# Patient Record
Sex: Male | Born: 1955 | Race: White | Hispanic: No | Marital: Married | Smoking: Former smoker
Health system: Southern US, Community
[De-identification: ages and names within clinical notes are randomized; demographics above are authoritative.]

## PROBLEM LIST (undated history)

## (undated) DIAGNOSIS — C801 Malignant (primary) neoplasm, unspecified: Secondary | ICD-10-CM

## (undated) DIAGNOSIS — G4730 Sleep apnea, unspecified: Secondary | ICD-10-CM

## (undated) DIAGNOSIS — N189 Chronic kidney disease, unspecified: Secondary | ICD-10-CM

## (undated) DIAGNOSIS — J302 Other seasonal allergic rhinitis: Secondary | ICD-10-CM

## (undated) DIAGNOSIS — M1990 Unspecified osteoarthritis, unspecified site: Secondary | ICD-10-CM

## (undated) HISTORY — PX: OTHER SURGICAL HISTORY: SHX169

## (undated) HISTORY — DX: Malignant (primary) neoplasm, unspecified: C80.1

## (undated) HISTORY — PX: LASIK: SHX215

## (undated) HISTORY — DX: Unspecified osteoarthritis, unspecified site: M19.90

## (undated) HISTORY — DX: Chronic kidney disease, unspecified: N18.9

## (undated) HISTORY — PX: TONSILLECTOMY: SUR1361

---

## 1999-08-28 ENCOUNTER — Ambulatory Visit: Admission: RE | Admit: 1999-08-28 | Discharge: 1999-08-28 | Payer: Self-pay | Admitting: Internal Medicine

## 2004-04-15 ENCOUNTER — Ambulatory Visit (HOSPITAL_COMMUNITY): Admission: RE | Admit: 2004-04-15 | Discharge: 2004-04-17 | Payer: Self-pay | Admitting: Orthopedic Surgery

## 2004-05-19 ENCOUNTER — Encounter: Admission: RE | Admit: 2004-05-19 | Discharge: 2004-05-19 | Payer: Self-pay | Admitting: Internal Medicine

## 2004-10-11 ENCOUNTER — Encounter: Admission: RE | Admit: 2004-10-11 | Discharge: 2004-10-11 | Payer: Self-pay | Admitting: Internal Medicine

## 2005-02-18 ENCOUNTER — Ambulatory Visit (HOSPITAL_COMMUNITY): Admission: RE | Admit: 2005-02-18 | Discharge: 2005-02-18 | Payer: Self-pay | Admitting: Orthopedic Surgery

## 2005-06-17 ENCOUNTER — Encounter: Admission: RE | Admit: 2005-06-17 | Discharge: 2005-06-17 | Payer: Self-pay | Admitting: Internal Medicine

## 2006-05-31 ENCOUNTER — Encounter: Admission: RE | Admit: 2006-05-31 | Discharge: 2006-05-31 | Payer: Self-pay | Admitting: Radiation Oncology

## 2006-10-16 ENCOUNTER — Encounter: Admission: RE | Admit: 2006-10-16 | Discharge: 2006-10-16 | Payer: Self-pay | Admitting: Internal Medicine

## 2011-01-01 ENCOUNTER — Encounter: Payer: Self-pay | Admitting: Internal Medicine

## 2011-01-02 ENCOUNTER — Encounter: Payer: Self-pay | Admitting: Internal Medicine

## 2011-08-21 ENCOUNTER — Emergency Department (HOSPITAL_COMMUNITY): Payer: BC Managed Care – PPO

## 2011-08-21 ENCOUNTER — Emergency Department (HOSPITAL_COMMUNITY)
Admission: EM | Admit: 2011-08-21 | Discharge: 2011-08-21 | Disposition: A | Discharge status: Home / Self Care | Payer: BC Managed Care – PPO | Attending: Emergency Medicine | Admitting: Emergency Medicine

## 2011-08-21 DIAGNOSIS — M545 Low back pain, unspecified: Secondary | ICD-10-CM | POA: Insufficient documentation

## 2011-08-21 DIAGNOSIS — E119 Type 2 diabetes mellitus without complications: Secondary | ICD-10-CM | POA: Insufficient documentation

## 2011-08-21 DIAGNOSIS — N201 Calculus of ureter: Secondary | ICD-10-CM | POA: Insufficient documentation

## 2011-08-21 DIAGNOSIS — R109 Unspecified abdominal pain: Secondary | ICD-10-CM | POA: Insufficient documentation

## 2011-08-21 DIAGNOSIS — N289 Disorder of kidney and ureter, unspecified: Secondary | ICD-10-CM | POA: Insufficient documentation

## 2011-08-21 LAB — POCT I-STAT, CHEM 8
BUN: 20 mg/dL (ref 6–23)
Calcium, Ion: 1.21 mmol/L (ref 1.12–1.32)
Chloride: 103 mEq/L (ref 96–112)
Creatinine, Ser: 1.1 mg/dL (ref 0.50–1.35)
Glucose, Bld: 168 mg/dL — ABNORMAL HIGH (ref 70–99)
HCT: 48 % (ref 39.0–52.0)
Hemoglobin: 16.3 g/dL (ref 13.0–17.0)
Potassium: 4.3 mEq/L (ref 3.5–5.1)
Sodium: 139 mEq/L (ref 135–145)
TCO2: 25 mmol/L (ref 0–100)

## 2011-08-21 LAB — URINE MICROSCOPIC-ADD ON

## 2011-08-21 LAB — URINALYSIS, ROUTINE W REFLEX MICROSCOPIC
Glucose, UA: NEGATIVE mg/dL
Ketones, ur: 40 mg/dL — AB
Nitrite: NEGATIVE
Protein, ur: 100 mg/dL — AB
Specific Gravity, Urine: 1.027 (ref 1.005–1.030)
Urobilinogen, UA: 1 mg/dL (ref 0.0–1.0)
pH: 5 (ref 5.0–8.0)

## 2011-08-23 ENCOUNTER — Other Ambulatory Visit: Payer: Self-pay | Admitting: Urology

## 2011-08-23 DIAGNOSIS — N281 Cyst of kidney, acquired: Secondary | ICD-10-CM

## 2011-08-23 LAB — URINE CULTURE
Colony Count: NO GROWTH
Culture  Setup Time: 201209092144
Culture: NO GROWTH

## 2011-08-24 ENCOUNTER — Encounter (HOSPITAL_COMMUNITY): Payer: BC Managed Care – PPO

## 2011-08-24 ENCOUNTER — Other Ambulatory Visit: Payer: Self-pay | Admitting: Urology

## 2011-08-24 LAB — CBC
HCT: 42.4 % (ref 39.0–52.0)
Hemoglobin: 13.6 g/dL (ref 13.0–17.0)
MCH: 28.3 pg (ref 26.0–34.0)
MCHC: 32.1 g/dL (ref 30.0–36.0)
MCV: 88.1 fL (ref 78.0–100.0)
Platelets: 302 10*3/uL (ref 150–400)
RBC: 4.81 MIL/uL (ref 4.22–5.81)
RDW: 13.7 % (ref 11.5–15.5)
WBC: 10.4 10*3/uL (ref 4.0–10.5)

## 2011-08-24 LAB — SURGICAL PCR SCREEN
MRSA, PCR: NEGATIVE
Staphylococcus aureus: NEGATIVE

## 2011-08-25 ENCOUNTER — Ambulatory Visit (HOSPITAL_COMMUNITY)
Admission: RE | Admit: 2011-08-25 | Discharge: 2011-08-25 | Disposition: A | Discharge status: Home / Self Care | Payer: BC Managed Care – PPO | Source: Ambulatory Visit | Attending: Urology | Admitting: Urology

## 2011-08-25 DIAGNOSIS — Z79899 Other long term (current) drug therapy: Secondary | ICD-10-CM | POA: Insufficient documentation

## 2011-08-25 DIAGNOSIS — Z01810 Encounter for preprocedural cardiovascular examination: Secondary | ICD-10-CM | POA: Insufficient documentation

## 2011-08-25 DIAGNOSIS — G4733 Obstructive sleep apnea (adult) (pediatric): Secondary | ICD-10-CM | POA: Insufficient documentation

## 2011-08-25 DIAGNOSIS — N201 Calculus of ureter: Secondary | ICD-10-CM | POA: Insufficient documentation

## 2011-08-25 DIAGNOSIS — Z01812 Encounter for preprocedural laboratory examination: Secondary | ICD-10-CM | POA: Insufficient documentation

## 2011-08-25 DIAGNOSIS — E7800 Pure hypercholesterolemia, unspecified: Secondary | ICD-10-CM | POA: Insufficient documentation

## 2011-08-25 LAB — GLUCOSE, CAPILLARY
Glucose-Capillary: 129 mg/dL — ABNORMAL HIGH (ref 70–99)
Glucose-Capillary: 131 mg/dL — ABNORMAL HIGH (ref 70–99)

## 2011-09-02 ENCOUNTER — Ambulatory Visit
Admission: RE | Admit: 2011-09-02 | Discharge: 2011-09-02 | Disposition: A | Discharge status: Home / Self Care | Payer: BC Managed Care – PPO | Source: Ambulatory Visit | Attending: Urology | Admitting: Urology

## 2011-09-02 DIAGNOSIS — N281 Cyst of kidney, acquired: Secondary | ICD-10-CM

## 2011-09-02 MED ORDER — GADOBENATE DIMEGLUMINE 529 MG/ML IV SOLN
20.0000 mL | Freq: Once | INTRAVENOUS | Status: AC | PRN
  Administered 2011-09-02: 20 mL via INTRAVENOUS

## 2011-09-16 NOTE — Op Note (Signed)
NAMERODRIQUES, BADIE NO.:  [id number]  MEDICAL RECORD NO.:  [id number]  LOCATION:  DAYL                         FACILITY:  [facility]  PHYSICIAN:  Excell Seltzer. Annabell Howells, M.D.    DATE OF BIRTH:  [birth year]  DATE OF PROCEDURE:  08/25/2011 DATE OF DISCHARGE:                              OPERATIVE REPORT   Patient of Dr. Bjorn Pippin.  PROCEDURE:  Cystoscopy, right retrograde pyelogram with interpretation, right ureteroscopic stone extraction with holmium lasertripsy, and insertion of right double-J stent.  PREOPERATIVE DIAGNOSIS:  Right distal stone.  POSTOPERATIVE DIAGNOSIS:  Right distal stone.  SURGEON:  Excell Seltzer. Annabell Howells, MD  ANESTHESIA:  General.  SPECIMEN:  Stone fragments.  DRAIN:  6-French 24-cm double-J stent.  COMPLICATIONS:  None.  INDICATIONS:  [name] is a 55-hour-old white male who was found to have a 9 mm right distal stone on recent CT.  In the ER, he was also found to have a 5.5 cm mass in the right kidney that is worrisome for neoplasm.  He is to undergo ureteroscopic stone extraction today.  FINDINGS OF PROCEDURE:  He was taken to the operating room where general anesthetic was induced.  He was given Cipro.  PAS hose were fitted.  He was placed in lithotomy position.  His perineum and genitalia were prepped with Betadine solution and he was draped in usual sterile fashion.  Cystoscopy was performed using the 22-French scope and 12 degree lens. Examination revealed a normal urethra.  The external sphincter was intact.  The prostatic urethra was approximately 3 cm in length with trilobar hyperplasia with coaptation, obstruction.  Examination of bladder revealed mild trabeculation; no tumors, stones, or inflammation were noted; ureteral orifices were unremarkable.  The right ureteral orifice was cannulated with 5-French open-end catheter and contrast was instilled.  This revealed a delicate distal ureter with a filling defect approximately 3  cm proximal to meatus, consistent with a stone.  A guidewire was then passed through the open-end catheter by the stone to the kidney and the cystoscope was removed.  A 12-French dilator was placed over the wire and the distal ureter was dilated.  A 6-French rigid ureteroscope was then passed alongside the wire and the stone was readily visualized.  The stone was then engaged with a 365 micron laser fiber set with the laser on 0.5 watts and 20 Hz and the stone was readily fragmented into manageable pieces.  A nitinol basket was then used to remove the majority of the fragments. A few small 7 mm fragments were not able to be retrieved with the basket and it was felt they would pass.  Once all the stones had been retrieved to the bladder, the cystoscope was re-inserted, and the bladder was evacuated free of stone fragments. The cystoscope was then re-inserted over the wire and a 6-French 24-cm double-J stent with string was then inserted into kidney under fluoroscopic guidance.  The wire was removed, leaving a good coil in the kidney, good coil in the bladder.  The bladder was drained, cystoscope was removed, leaving the stent string exiting urethra, and the stent position was then confirmed.  The string was secured to the patient's penis.  B  and O suppository was placed.  He was taken down from lithotomy position.  His anesthetic was reversed.  He was moved to the recovery room in stable condition.  There were no complications.     Excell Seltzer. Annabell Howells, M.D.     JJW/MEDQ  D:  08/25/2011  T:  08/25/2011  Job:  [id number]  Electronically Signed by Bjorn Pippin M.D. on 09/16/2011 01:02:39 PM

## 2011-09-30 ENCOUNTER — Other Ambulatory Visit (HOSPITAL_COMMUNITY): Payer: Self-pay | Admitting: Urology

## 2011-09-30 ENCOUNTER — Ambulatory Visit (HOSPITAL_COMMUNITY)
Admission: RE | Admit: 2011-09-30 | Discharge: 2011-09-30 | Disposition: A | Discharge status: Home / Self Care | Payer: BC Managed Care – PPO | Source: Ambulatory Visit | Attending: Urology | Admitting: Urology

## 2011-09-30 DIAGNOSIS — D4959 Neoplasm of unspecified behavior of other genitourinary organ: Secondary | ICD-10-CM | POA: Insufficient documentation

## 2011-09-30 DIAGNOSIS — M47814 Spondylosis without myelopathy or radiculopathy, thoracic region: Secondary | ICD-10-CM | POA: Insufficient documentation

## 2011-09-30 DIAGNOSIS — D499 Neoplasm of unspecified behavior of unspecified site: Secondary | ICD-10-CM

## 2011-10-19 ENCOUNTER — Encounter (HOSPITAL_COMMUNITY): Payer: Self-pay | Admitting: Pharmacy Technician

## 2011-10-24 ENCOUNTER — Encounter (HOSPITAL_COMMUNITY): Payer: BC Managed Care – PPO

## 2011-10-24 ENCOUNTER — Encounter (HOSPITAL_COMMUNITY): Payer: Self-pay

## 2011-10-24 LAB — BASIC METABOLIC PANEL
CO2: 26 mEq/L (ref 19–32)
Chloride: 102 mEq/L (ref 96–112)
Creatinine, Ser: 0.85 mg/dL (ref 0.50–1.35)
Potassium: 3.9 mEq/L (ref 3.5–5.1)

## 2011-10-24 LAB — CBC
HCT: 43.6 % (ref 39.0–52.0)
Hemoglobin: 14.3 g/dL (ref 13.0–17.0)
MCV: 87 fL (ref 78.0–100.0)
RBC: 5.01 MIL/uL (ref 4.22–5.81)
RDW: 14.3 % (ref 11.5–15.5)
WBC: 6.4 10*3/uL (ref 4.0–10.5)

## 2011-10-24 LAB — SURGICAL PCR SCREEN: Staphylococcus aureus: NEGATIVE

## 2011-10-24 NOTE — Pre-Procedure Instructions (Signed)
10/24/11 ekg done 08/24/11 in epic  CXR done 09/30/11 in epic

## 2011-10-24 NOTE — Patient Instructions (Addendum)
20 REZNOR FERRANDO  10/24/2011   Your procedure is scheduled on:  1100-230 pm  am 10/31/11   Report to [facility] at 0830  Call this number if you have problems the morning of surgery: [phone]   Remember: mechanical bowel prep per office instructions  Bring inhalers with you  Bring CPAP mask and tubing.    Do not eat food:After Midnight.  Do not drink clear liquids: After Midnight.  Take these medicines the morning of surgery with A SIP OF WATER: Zyrtec, rhinocort    Do not wear jewelry.   Do not wear lotions, powders, or perfumes.    Do not shave 48 hours prior to surgery.  Do not bring valuables to the hospital.  Contacts, dentures or bridgework may not be worn into surgery.  Leave suitcase in the car. After surgery it may be brought to your room.  For patients admitted to the hospital, checkout time is 11:00 AM the day of discharge.   Patients discharged the day of surgery will not be allowed to drive home.    Special Instructions: CHG Shower Use Special Wash: 1/2 bottle night before surgery and 1/2 bottle morning of surgery.   Please read over the following fact sheets that you were given: MRSA Information Incentive Fact sheet

## 2011-10-31 ENCOUNTER — Inpatient Hospital Stay (HOSPITAL_COMMUNITY)
Admission: RE | Admit: 2011-10-31 | Discharge: 2011-11-02 | DRG: 303 | Disposition: A | Discharge status: Home / Self Care | Payer: BC Managed Care – PPO | Source: Ambulatory Visit | Attending: Urology | Admitting: Urology

## 2011-10-31 ENCOUNTER — Other Ambulatory Visit: Payer: Self-pay | Admitting: Urology

## 2011-10-31 ENCOUNTER — Encounter (HOSPITAL_COMMUNITY): Payer: Self-pay | Admitting: Anesthesiology

## 2011-10-31 ENCOUNTER — Inpatient Hospital Stay (HOSPITAL_COMMUNITY): Payer: BC Managed Care – PPO | Admitting: Anesthesiology

## 2011-10-31 ENCOUNTER — Encounter (HOSPITAL_COMMUNITY): Payer: Self-pay | Admitting: *Deleted

## 2011-10-31 ENCOUNTER — Encounter (HOSPITAL_COMMUNITY)
Admission: RE | Disposition: A | Discharge status: Home / Self Care | Payer: Self-pay | Source: Ambulatory Visit | Attending: Urology

## 2011-10-31 DIAGNOSIS — E7800 Pure hypercholesterolemia, unspecified: Secondary | ICD-10-CM | POA: Diagnosis present

## 2011-10-31 DIAGNOSIS — E119 Type 2 diabetes mellitus without complications: Secondary | ICD-10-CM | POA: Diagnosis present

## 2011-10-31 DIAGNOSIS — G4730 Sleep apnea, unspecified: Secondary | ICD-10-CM | POA: Diagnosis present

## 2011-10-31 DIAGNOSIS — Z87891 Personal history of nicotine dependence: Secondary | ICD-10-CM

## 2011-10-31 DIAGNOSIS — C649 Malignant neoplasm of unspecified kidney, except renal pelvis: Principal | ICD-10-CM | POA: Diagnosis present

## 2011-10-31 DIAGNOSIS — Z87442 Personal history of urinary calculi: Secondary | ICD-10-CM

## 2011-10-31 HISTORY — PX: ROBOT ASSISTED LAPAROSCOPIC NEPHRECTOMY: SHX5140

## 2011-10-31 LAB — BASIC METABOLIC PANEL
BUN: 11 mg/dL (ref 6–23)
CO2: 24 mEq/L (ref 19–32)
Chloride: 99 mEq/L (ref 96–112)
GFR calc Af Amer: >90 mL/min (ref >90)
Glucose, Bld: 175 mg/dL — ABNORMAL HIGH (ref 70–99)
Potassium: 4 mEq/L (ref 3.5–5.1)

## 2011-10-31 LAB — GLUCOSE, CAPILLARY
Glucose-Capillary: 115 mg/dL — ABNORMAL HIGH (ref 70–99)
Glucose-Capillary: 157 mg/dL — ABNORMAL HIGH (ref 70–99)

## 2011-10-31 LAB — ABO/RH: ABO/RH(D): O POS

## 2011-10-31 SURGERY — ROBOTIC ASSISTED LAPAROSCOPIC NEPHRECTOMY
Anesthesia: General | Laterality: Right | Wound class: Clean Contaminated

## 2011-10-31 MED ORDER — ONDANSETRON HCL 4 MG/2ML IJ SOLN
INTRAMUSCULAR | Status: DC | PRN
  Administered 2011-10-31: 4 mg via INTRAVENOUS

## 2011-10-31 MED ORDER — HYDROMORPHONE HCL PF 1 MG/ML IJ SOLN
INTRAMUSCULAR | Status: DC | PRN
  Administered 2011-10-31 (×2): 0.5 mg via INTRAVENOUS

## 2011-10-31 MED ORDER — HYDRALAZINE HCL 20 MG/ML IJ SOLN
INTRAMUSCULAR | Status: DC | PRN
  Administered 2011-10-31: 6 mg via INTRAVENOUS
  Administered 2011-10-31: 2 mg via INTRAVENOUS

## 2011-10-31 MED ORDER — ROCURONIUM BROMIDE 100 MG/10ML IV SOLN
INTRAVENOUS | Status: DC | PRN
  Administered 2011-10-31: 10 mg via INTRAVENOUS
  Administered 2011-10-31 (×2): 20 mg via INTRAVENOUS
  Administered 2011-10-31 (×4): 10 mg via INTRAVENOUS
  Administered 2011-10-31: 50 mg via INTRAVENOUS

## 2011-10-31 MED ORDER — PROPOFOL 10 MG/ML IV EMUL
INTRAVENOUS | Status: DC | PRN
  Administered 2011-10-31: 200 mg via INTRAVENOUS

## 2011-10-31 MED ORDER — MANNITOL 25 % IV SOLN
INTRAVENOUS | Status: DC | PRN
  Administered 2011-10-31: 12.5 g via INTRAVENOUS

## 2011-10-31 MED ORDER — BUPIVACAINE LIPOSOME 1.3 % IJ SUSP
INTRAMUSCULAR | Status: DC | PRN
  Administered 2011-10-31: 20 mL

## 2011-10-31 MED ORDER — CEFAZOLIN SODIUM-DEXTROSE 2-3 GM-% IV SOLR
2.0000 g | Freq: Once | INTRAVENOUS | Status: AC
  Administered 2011-10-31: 2 g via INTRAVENOUS

## 2011-10-31 MED ORDER — INFLUENZA VIRUS VACC SPLIT PF IM SUSP
0.5000 mL | INTRAMUSCULAR | Status: AC
  Administered 2011-11-01: 0.5 mL via INTRAMUSCULAR
  Filled 2011-10-31: qty 0.5

## 2011-10-31 MED ORDER — DOCUSATE SODIUM 100 MG PO CAPS
100.0000 mg | ORAL_CAPSULE | Freq: Two times a day (BID) | ORAL | Status: DC
  Administered 2011-10-31 – 2011-11-02 (×5): 100 mg via ORAL
  Filled 2011-10-31 (×5): qty 1

## 2011-10-31 MED ORDER — ACETAMINOPHEN 10 MG/ML IV SOLN
1000.0000 mg | Freq: Four times a day (QID) | INTRAVENOUS | Status: DC
  Administered 2011-10-31 – 2011-11-01 (×3): 1000 mg via INTRAVENOUS
  Filled 2011-10-31 (×4): qty 100

## 2011-10-31 MED ORDER — FLUTICASONE-SALMETEROL 100-50 MCG/DOSE IN AEPB
1.0000 | INHALATION_SPRAY | Freq: Two times a day (BID) | RESPIRATORY_TRACT | Status: DC
Start: 2011-10-31 — End: 2011-11-02
  Administered 2011-10-31 – 2011-11-02 (×4): 1 via RESPIRATORY_TRACT
  Filled 2011-10-31: qty 14

## 2011-10-31 MED ORDER — ZOLPIDEM TARTRATE 5 MG PO TABS: 5.0000 mg | ORAL_TABLET | Freq: Every evening | ORAL | Status: DC | PRN

## 2011-10-31 MED ORDER — DIPHENHYDRAMINE HCL 12.5 MG/5ML PO ELIX
12.5000 mg | ORAL_SOLUTION | Freq: Four times a day (QID) | ORAL | Status: DC | PRN
Start: 2011-10-31 — End: 2011-11-02

## 2011-10-31 MED ORDER — MANNITOL 25 % IV SOLN
INTRAVENOUS | Status: AC
  Filled 2011-10-31: qty 100

## 2011-10-31 MED ORDER — SODIUM CHLORIDE 0.45 % IV SOLN
INTRAVENOUS | Status: DC
  Administered 2011-10-31 – 2011-11-01 (×4): via INTRAVENOUS

## 2011-10-31 MED ORDER — HYDROMORPHONE HCL PF 1 MG/ML IJ SOLN
INTRAMUSCULAR | Status: AC
  Filled 2011-10-31: qty 1

## 2011-10-31 MED ORDER — CEFAZOLIN SODIUM 1-5 GM-% IV SOLN
1.0000 g | Freq: Three times a day (TID) | INTRAVENOUS | Status: AC
  Administered 2011-10-31 – 2011-11-01 (×2): 1 g via INTRAVENOUS
  Filled 2011-10-31 (×2): qty 50

## 2011-10-31 MED ORDER — MIDAZOLAM HCL 5 MG/5ML IJ SOLN
INTRAMUSCULAR | Status: DC | PRN
  Administered 2011-10-31: 2 mg via INTRAVENOUS

## 2011-10-31 MED ORDER — INSULIN ASPART 100 UNIT/ML ~~LOC~~ SOLN
0.0000 [IU] | SUBCUTANEOUS | Status: DC
  Administered 2011-10-31 – 2011-11-01 (×2): 4 [IU] via SUBCUTANEOUS
  Administered 2011-11-01 (×2): 3 [IU] via SUBCUTANEOUS
  Filled 2011-10-31: qty 3

## 2011-10-31 MED ORDER — MORPHINE SULFATE 2 MG/ML IJ SOLN
2.0000 mg | INTRAMUSCULAR | Status: DC | PRN
  Administered 2011-10-31 (×2): 2 mg via INTRAVENOUS
  Administered 2011-11-01 (×3): 4 mg via INTRAVENOUS
  Administered 2011-11-01: 2 mg via INTRAVENOUS
  Filled 2011-10-31 (×3): qty 1
  Filled 2011-10-31 (×3): qty 2

## 2011-10-31 MED ORDER — LIDOCAINE HCL (CARDIAC) 20 MG/ML IV SOLN
INTRAVENOUS | Status: DC | PRN
  Administered 2011-10-31: 100 mg via INTRAVENOUS

## 2011-10-31 MED ORDER — LACTATED RINGERS IV SOLN
INTRAVENOUS | Status: DC
  Administered 2011-10-31 (×2): via INTRAVENOUS
  Administered 2011-10-31: 1000 mL via INTRAVENOUS

## 2011-10-31 MED ORDER — FENTANYL CITRATE 0.05 MG/ML IJ SOLN
INTRAMUSCULAR | Status: DC | PRN
  Administered 2011-10-31 (×4): 50 ug via INTRAVENOUS
  Administered 2011-10-31 (×2): 100 ug via INTRAVENOUS
  Administered 2011-10-31: 50 ug via INTRAVENOUS

## 2011-10-31 MED ORDER — SIMVASTATIN 20 MG PO TABS
20.0000 mg | ORAL_TABLET | Freq: Every day | ORAL | Status: DC
  Administered 2011-10-31 – 2011-11-01 (×2): 20 mg via ORAL
  Filled 2011-10-31 (×3): qty 1

## 2011-10-31 MED ORDER — BUPIVACAINE LIPOSOME 1.3 % IJ SUSP
20.0000 mL | INTRAMUSCULAR | Status: DC
  Filled 2011-10-31: qty 20

## 2011-10-31 MED ORDER — ACETAMINOPHEN 10 MG/ML IV SOLN
INTRAVENOUS | Status: AC
  Filled 2011-10-31: qty 100

## 2011-10-31 MED ORDER — NEOSTIGMINE METHYLSULFATE 1 MG/ML IJ SOLN
INTRAMUSCULAR | Status: DC | PRN
  Administered 2011-10-31: 5 mg via INTRAVENOUS

## 2011-10-31 MED ORDER — PROMETHAZINE HCL 25 MG/ML IJ SOLN
INTRAMUSCULAR | Status: AC
  Filled 2011-10-31: qty 1

## 2011-10-31 MED ORDER — ONDANSETRON HCL 4 MG/2ML IJ SOLN: 4.0000 mg | INTRAMUSCULAR | Status: DC | PRN

## 2011-10-31 MED ORDER — HYDROCODONE-ACETAMINOPHEN 5-325 MG PO TABS: 1.0000 | ORAL_TABLET | Freq: Four times a day (QID) | ORAL | Status: DC | PRN

## 2011-10-31 MED ORDER — HYDROMORPHONE HCL PF 1 MG/ML IJ SOLN
0.2500 mg | INTRAMUSCULAR | Status: DC | PRN
  Administered 2011-10-31: 0.5 mg via INTRAVENOUS
  Administered 2011-10-31 (×2): 0.25 mg via INTRAVENOUS

## 2011-10-31 MED ORDER — DIPHENHYDRAMINE HCL 50 MG/ML IJ SOLN: 12.5000 mg | Freq: Four times a day (QID) | INTRAMUSCULAR | Status: DC | PRN

## 2011-10-31 MED ORDER — ACETAMINOPHEN 10 MG/ML IV SOLN
INTRAVENOUS | Status: DC | PRN
  Administered 2011-10-31: 1000 mg via INTRAVENOUS

## 2011-10-31 MED ORDER — LABETALOL HCL 5 MG/ML IV SOLN
INTRAVENOUS | Status: DC | PRN
  Administered 2011-10-31: 5 mg via INTRAVENOUS

## 2011-10-31 MED ORDER — LACTATED RINGERS IR SOLN
Status: DC | PRN
  Administered 2011-10-31: 1000 mL

## 2011-10-31 MED ORDER — GLYCOPYRROLATE 0.2 MG/ML IJ SOLN
INTRAMUSCULAR | Status: DC | PRN
  Administered 2011-10-31: .8 mg via INTRAVENOUS

## 2011-10-31 MED ORDER — PROMETHAZINE HCL 25 MG/ML IJ SOLN
6.2500 mg | INTRAMUSCULAR | Status: DC | PRN
  Administered 2011-10-31: 6.25 mg via INTRAVENOUS

## 2011-10-31 SURGICAL SUPPLY — 72 items
ADH SKN CLS APL DERMABOND .7 (GAUZE/BANDAGES/DRESSINGS)
APL ESCP 34 STRL LF DISP (HEMOSTASIS) ×1
APPLICATOR SURGIFLO ENDO (HEMOSTASIS) ×1 IMPLANT
BAG SPEC RTRVL LRG 6X4 10 (ENDOMECHANICALS) ×1
CANNULA SEAL DVNC (CANNULA) ×3 IMPLANT
CANNULA SEALS DA VINCI (CANNULA) ×1
CHLORAPREP W/TINT 26ML (MISCELLANEOUS) ×3 IMPLANT
CLIP LIGATING HEM O LOK PURPLE (MISCELLANEOUS) ×1 IMPLANT
CLIP LIGATING HEMO O LOK GREEN (MISCELLANEOUS) ×2 IMPLANT
CLIP SUT LAPRA TY ABSORB (SUTURE) ×2 IMPLANT
CLOTH BEACON ORANGE TIMEOUT ST (SAFETY) ×2 IMPLANT
CORDS BIPOLAR (ELECTRODE) ×2 IMPLANT
COVER SURGICAL LIGHT HANDLE (MISCELLANEOUS) ×2 IMPLANT
COVER TIP SHEARS 8 DVNC (MISCELLANEOUS) ×1 IMPLANT
COVER TIP SHEARS 8MM DA VINCI (MISCELLANEOUS) ×1
DECANTER SPIKE VIAL GLASS SM (MISCELLANEOUS) ×1 IMPLANT
DERMABOND ADVANCED (GAUZE/BANDAGES/DRESSINGS)
DERMABOND ADVANCED .7 DNX12 (GAUZE/BANDAGES/DRESSINGS) ×2 IMPLANT
DRAIN CHANNEL 15F RND FF 3/16 (WOUND CARE) ×2 IMPLANT
DRAPE INCISE IOBAN 66X45 STRL (DRAPES) ×2 IMPLANT
DRAPE LAPAROSCOPIC ABDOMINAL (DRAPES) ×2 IMPLANT
DRAPE LG THREE QUARTER DISP (DRAPES) ×4 IMPLANT
DRAPE TABLE BACK 44X90 PK DISP (DRAPES) ×2 IMPLANT
DRAPE WARM FLUID 44X44 (DRAPE) ×2 IMPLANT
DRESSING SURGICEL FIBRLLR 1X2 (HEMOSTASIS) ×1 IMPLANT
DRSG SURGICEL FIBRILLAR 1X2 (HEMOSTASIS)
ELECT REM PT RETURN 9FT ADLT (ELECTROSURGICAL) ×4
ELECTRODE REM PT RTRN 9FT ADLT (ELECTROSURGICAL) ×2 IMPLANT
EVACUATOR SILICONE 100CC (DRAIN) ×2 IMPLANT
FLOSEAL 10ML (HEMOSTASIS) ×1 IMPLANT
GAUZE VASELINE 3X9 (GAUZE/BANDAGES/DRESSINGS) ×2 IMPLANT
GLOVE BIOGEL M STRL SZ7.5 (GLOVE) ×5 IMPLANT
GLOVE SURG SS PI 6.5 STRL IVOR (GLOVE) ×2 IMPLANT
GLOVE SURG SS PI 8.5 STRL IVOR (GLOVE) ×2
GLOVE SURG SS PI 8.5 STRL STRW (GLOVE) IMPLANT
GOWN BRE IMP SLV AUR XL STRL (GOWN DISPOSABLE) ×1 IMPLANT
GOWN STRL NON-REIN LRG LVL3 (GOWN DISPOSABLE) ×14 IMPLANT
HEMOSTAT SURGICEL 4X8 (HEMOSTASIS) IMPLANT
KIT ACCESSORY DA VINCI DISP (KITS) ×1
KIT ACCESSORY DVNC DISP (KITS) ×1 IMPLANT
KIT BASIN OR (CUSTOM PROCEDURE TRAY) ×2 IMPLANT
NS IRRIG 1000ML POUR BTL (IV SOLUTION) ×1 IMPLANT
PEN SKIN MARKING BROAD (MISCELLANEOUS) ×1 IMPLANT
PENCIL BUTTON HOLSTER BLD 10FT (ELECTRODE) ×2 IMPLANT
POSITIONER SURGICAL ARM (MISCELLANEOUS) ×4 IMPLANT
POUCH SPECIMEN RETRIEVAL 10MM (ENDOMECHANICALS) ×2 IMPLANT
SET TUBE IRRIG SUCTION NO TIP (IRRIGATION / IRRIGATOR) IMPLANT
SOLUTION ANTI FOG 6CC (MISCELLANEOUS) ×2 IMPLANT
SOLUTION ELECTROLUBE (MISCELLANEOUS) ×2 IMPLANT
SPONGE LAP 18X18 X RAY DECT (DISPOSABLE) ×1 IMPLANT
SURGIFLO W/THROMBIN 8M KIT (HEMOSTASIS) ×2 IMPLANT
SUT ETHILON 3 0 PS 1 (SUTURE) ×2 IMPLANT
SUT MNCRL 3 0 RB1 (SUTURE) IMPLANT
SUT MNCRL AB 4-0 PS2 18 (SUTURE) ×3 IMPLANT
SUT MON AB 2-0 SH 27 (SUTURE) ×7 IMPLANT
SUT MON AB 2-0 SH27 (SUTURE) ×1 IMPLANT
SUT MONOCRYL 3 0 RB1 (SUTURE) ×2
SUT VIC AB 0 CT1 27 (SUTURE) ×2
SUT VIC AB 0 CT1 27XBRD ANTBC (SUTURE) ×2 IMPLANT
SUT VIC AB 0 UR5 27 (SUTURE) ×2 IMPLANT
SUT VIC AB 2-0 SH 27 (SUTURE) ×4
SUT VIC AB 2-0 SH 27X BRD (SUTURE) ×2 IMPLANT
SUT VIC AB 4-0 RB1 27 (SUTURE)
SUT VIC AB 4-0 RB1 27XBRD (SUTURE) ×2 IMPLANT
SUT VICRYL 0 UR6 27IN ABS (SUTURE) ×4 IMPLANT
SYR BULB IRRIGATION 50ML (SYRINGE) IMPLANT
TRAY FOLEY CATH 14FRSI W/METER (CATHETERS) ×2 IMPLANT
TRAY LAP CHOLE (CUSTOM PROCEDURE TRAY) ×2 IMPLANT
TROCAR ENDOPATH XCEL 12X100 BL (ENDOMECHANICALS) ×2 IMPLANT
TROCAR XCEL 12X100 BLDLESS (ENDOMECHANICALS) ×2 IMPLANT
TUBING INSUFFLATION 10FT LAP (TUBING) ×2 IMPLANT
WATER STERILE IRR 1500ML POUR (IV SOLUTION) ×4 IMPLANT

## 2011-10-31 NOTE — H&P (Signed)
Chief Complaint  Right renal neoplasm   History of Present Illness  [name] is a 55 year old with a history of morbid obesity, diabetes, sleep apnea, and hypercholesterolemia who presented to Dr. Bjorn [name] with a 10 mm distal right ureteral stone which had been found after evaluation in the emergency department in September 2012.  He underwent definitive treatment with ureteroscopic laser lithotripsy on 08/25/11.  He was noted to have an incidentally detected complex cystic mass measuring 5.5 cm on the right kidney prompting further evaluation with an MRI.  He has been completely asymptomatic with regard to this mass with no hematuria or abdominal mass/pain symptoms except during his acute stone event. He has no family history of kidney cancer. His Hgb is 13.6.  He has not undergone chest imaging.  His MRI demonstrates a 6.3 by 5.5 cm right upper pole renal mass off the posterior aspect of the upper pole with cystic components but clearly enhancing components most concerning for renal cell carcinoma.  It is mostly exophytic although does extend up to the collecting system of the kidney. There appears to be two renal arteries with a main right renal artery and another smaller artery entering the upper pole.  There is a single renal vein.  There is no evidence of renal vein/IVC involvement, regional lymph node disease, and no other evidence to suggest abdominal metastases. There is a 3.5 cm Bosniak II renal cyst in the lower pole of the left kidney.   Past Medical History Problems  1. History of  Arthritis V13.4 2. History of  Asthma 493.90 3. History of  Hypercholesterolemia 272.0 4. Morbid Obesity 278.01 5. History of  Sleep Apnea 780.57 6. History of  Type 2 Diabetes Mellitus 250.00 7. History of  Ureteral Stone 592.1   He does use CPAP.   Surgical History Problems  1. History of  Cystoscopy With Insertion Of Ureteral Stent Right 2. History of  Cystoscopy With Ureteroscopy With  Lithotripsy 3. History of  Hand Repair   He also has had extensive nasal and laryngeal surgery for obstructive symptoms.    Current Meds 1. Advair Diskus 100-50 MCG/DOSE Inhalation Aerosol Powder Breath Activated; Therapy:  27Dec2011 to 2. Advair HFA AERO; Therapy: (Recorded:10Sep2012) to 3. Cialis TABS; Therapy: (Recorded:10Sep2012) to 4. CPAP; Therapy: (Recorded:10Sep2012) to 5. MetFORMIN HCl TABS; Therapy: (Recorded:10Sep2012) to 6. Rhinocort Aqua SUSP; Therapy: (Recorded:10Sep2012) to 7. Simvastatin 20 MG Oral Tablet; Therapy: 10Sep2012 to 8. ZyrTEC Allergy 10 MG Oral Tablet; Therapy: (Recorded:10Sep2012) to  Allergies Medication  1. Sulfa Drugs  Family History Problems  1. Family history of  Death In The Family Father 2. Family history of  Death In The Family Mother 3. Paternal history of  Lung Cancer V16.1  Social History Problems    Alcohol Use seldom   Former Smoker V15.82 smoked 1 ppd; quit 17 years ago   Marital History - Currently Married   Occupation: management  Review of Systems Constitutional, skin, eye, otolaryngeal, hematologic/lymphatic, cardiovascular, pulmonary, endocrine, musculoskeletal, gastrointestinal, neurological and psychiatric system(s) were reviewed and pertinent findings if present are noted.  Cardiovascular: no chest pain and no leg swelling.  Respiratory: no shortness of breath and no cough.    Vitals  BMI Calculated: 48.34 BSA Calculated: 2.43 Height: 5 ft 7 in Weight: 308 lb  Blood Pressure: 135 / 77 Temperature: 99 F Heart Rate: 77  Physical Exam Constitutional: Well nourished and well developed . No acute distress.  ENT:. The ears and nose are normal in  appearance.  Neck: The appearance of the neck is normal and no neck mass is present.  Pulmonary: No respiratory distress, normal respiratory rhythm and effort and clear bilateral breath sounds.  Cardiovascular: Heart rate and rhythm are normal . No peripheral edema.    Abdomen: The abdomen is obese. The abdomen is soft and nontender. No masses are palpated. No CVA tenderness. No hernias are palpable. No hepatosplenomegaly noted.  Lymphatics: The supraclavicular, femoral and inguinal nodes are not enlarged or tender.  Skin: Normal skin turgor, no visible rash and no visible skin lesions.  Neuro/Psych:. Mood and affect are appropriate.    Results/Data  I independently reviewed his MRI and CT scan with findings as dictated above.     Assessment Assessed  1. Renal Neoplasm 239.5    Discussion/Summary  1. 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  He has elected to proceed with a right robotic assisted laparoscopic partial nephrectomy.  The patient was provided information regarding their renal mass including the relative risk of benign versus malignant pathology and the natural history of renal cell carcinoma and other possible malignancies of the kidney. The role of renal biopsy, laboratory testing, and imaging studies to further characterize renal masses and/or the presence of metastatic disease were explained. We discussed the role of active surveillance, surgical therapy with both radical nephrectomy and nephron-sparing surgery, and ablative therapy in the treatment of renal masses. In addition, we discussed our goals of providing an accurate diagnosis and oncologic control while maintaining optimal renal function as appropriate based on the size, location, and complexity of their renal mass as well as their co-morbidities.    We have discussed the risks of treatment in detail including but not limited to bleeding, infection, heart attack, stroke, death, venothromoboembolism, cancer recurrence, injury/damage to surrounding organs and structures, urine leak, the possibility of open surgical conversion for patients undergoing minimally invasive surgery, the risk of developing chronic  kidney disease and its associated implications, and the potential risk of end stage renal disease possibly necessitating dialysis.

## 2011-10-31 NOTE — Anesthesia Procedure Notes (Signed)
Date/Time: 10/31/2011 11:37 AM Performed by: Mechele Dawley Intubation Type: Circoid Pressure applied Ventilation: Oral airway inserted - appropriate to patient size and Nasal airway inserted- appropriate to patient size

## 2011-10-31 NOTE — Anesthesia Preprocedure Evaluation (Addendum)
Anesthesia Evaluation  Patient identified by MRN, date of birth, ID band Patient awake    Reviewed: Allergy & Precautions, H&P , NPO status , Patient's Chart, lab work & pertinent test results  Airway Mallampati: III TM Distance: <3 FB Neck ROM: Full    Dental  (+) Teeth Intact   Pulmonary neg pulmonary ROS, asthma ,    + decreased breath sounds      Cardiovascular neg cardio ROS Regular     Neuro/Psych Negative Neurological ROS  Negative Psych ROS   GI/Hepatic negative GI ROS, Neg liver ROS,   Endo/Other  Negative Endocrine ROSDiabetes mellitus-Morbid obesity  Renal/GU negative Renal ROS  Genitourinary negative   Musculoskeletal negative musculoskeletal ROS (+)   Abdominal (+) obese,   Peds negative pediatric ROS (+)  Hematology negative hematology ROS (+)   Anesthesia Other Findings   Reproductive/Obstetrics negative OB ROS                          Anesthesia Physical Anesthesia Plan  ASA: III  Anesthesia Plan: General   Post-op Pain Management:    Induction: Intravenous  Airway Management Planned: Oral ETT  Additional Equipment:   Intra-op Plan:   Post-operative Plan: Extubation in OR  Informed Consent: I have reviewed the patients History and Physical, chart, labs and discussed the procedure including the risks, benefits and alternatives for the proposed anesthesia with the patient or authorized representative who has indicated his/her understanding and acceptance.   Dental advisory given  Plan Discussed with: CRNA  Anesthesia Plan Comments:        Anesthesia Quick Evaluation

## 2011-10-31 NOTE — Anesthesia Postprocedure Evaluation (Signed)
  Anesthesia Post-op Note  Patient: Derek [name]  Procedure(s) Performed:  ROBOTIC ASSISTED LAPAROSCOPIC NEPHRECTOMY - Right Robotic Assisted Laparoscopic Partial Nephrectomy  Patient Location: PACU  Anesthesia Type: General  Level of Consciousness: awake and alert   Airway and Oxygen Therapy: Patient Spontanous Breathing  Post-op Pain: mild  Post-op Assessment: Post-op Vital signs reviewed, Patient's Cardiovascular Status Stable, Respiratory Function Stable, Patent Airway and No signs of Nausea or vomiting  Post-op Vital Signs: stable  Complications: No apparent anesthesia complications

## 2011-10-31 NOTE — Progress Notes (Signed)
Pt has had decreased urinary output of 50ml in the last 4 hrs. JP 110 output and of IVF in. Pt does have some small clot formation that is in his foley catheter but he denies any cramping or additional pain other than surgical incisional sites. MD notified. Orders received and will be carried out; will continue to monitor pt.

## 2011-10-31 NOTE — Op Note (Addendum)
Preoperative diagnosis: Right renal neoplasm  Postoperative diagnosis: RIght renal neoplasm  Procedure:  1. Right robotic-assisted laparoscopic partial nephrectomy  Surgeon: Moody Bruins. M.D.  Assistant(s): Dr. Natalia Leatherwood and Pecola Leisure, [place]  Anesthesia: General  Complications: None  EBL: 150 mL  IVF:  3000 mL crystalloid  Specimens: 1. Right renal neoplasm 2. Right renal tumor margin  Disposition of specimens: Pathology  Intraoperative findings:       1. Warm renal ischemia time: 30 minutes       2. Tumor margin specimen - positive for renal cell carcinoma, additional margin specimen - negative for malignancy  Drains: 1. # 15 Blake perinephric drain  Indication:  Derek [name] is a 55 y.o. year old patient with a right renal neoplasm.  After a thorough review of the management options for their renal mass, they elected to proceed with surgical treatment and the above procedure.  We have discussed the potential benefits and risks of the procedure, side effects of the proposed treatment, the likelihood of the patient achieving the goals of the procedure, and any potential problems that might occur during the procedure or recuperation. Informed consent has been obtained.   Description of procedure:  The patient was taken to the operating room and a general anesthetic was administered. The patient was given preoperative antibiotics, placed in the right modified flank position with care to pad all potential pressure points, and prepped and draped in the usual sterile fashion. Next a preoperative timeout was performed.  Due to his obesity, a site was selected well to the right side of the umbilicus for placement of the camera port. This was placed using a standard open Hassan technique which allowed entry into the peritoneal cavity under direct vision and without difficulty. A 12 mm port was placed and a pneumoperitoneum established. The camera was then  used to inspect the abdomen and there was no evidence of any intra-abdominal injuries or other abnormalities. The remaining abdominal ports were then placed. 8 mm robotic ports were placed in the right upper quadrant, right lower quadrant, and far right lateral abdominal wall. A 12 mm port was placed in the right upper quadrant for laparoscopic assistance. All ports were placed under direct vision without difficulty. The surgical cart was then docked.   Utilizing the cautery scissors, the white line of Toldt was incised allowing the colon to be mobilized medially and the plane between the mesocolon and the anterior layer of Gerota's fascia to be developed and the kidney to be exposed. This dissection was very tedious and took extensive time due to the amount of perinephric fat and his obesity.  The ureter and gonadal vein were identified inferiorly and the ureter was lifted anteriorly off the psoas muscle.  Dissection proceeded superiorly along the gonadal vein until the renal vein was identified.  The renal hilum was then carefully isolated with a combination of blunt and sharp dissection allowing the renal arterial and venous structures to be separated and isolated in preparation for renal hilar vessel clamping. There were 3 separate renal arteries noted with one supplying the lower pole, and main renal artery just posterior to the renal vein and a smaller upper pole renal artery. These arteries were isolated and prepared for clamping.  12.5 g of IV mannitol was then administered.   Attention turned to the kidney and the perinephric fat surrounding the renal mass was removed and the kidney was mobilized sufficiently for exposure and resection of the renal mass. The  kidney required complete mobilization due to the location of the tumor on the posterior upper pole of the kidney. This dissection also took more extensive time due to the amount of perinephric fat present.  Once the renal mass was properly  isolated, preparations were made for resection of the tumor.  Reconstructive sutures were placed into the abdomen for the renorrhaphy portion of the procedure.  The two more cephald renal arteries were then clamped with bulldog clamps.  The tumor was then excised with cold scissor dissection.. The renal collecting system was clearly entered during removal of the tumor. The tumor was mostly exophytic but did extend deeply into the kidney.  There was a very small area of yellow tissue off the tip of the tumor that raised concern for tumor vs. renal sinus fat.  This was sent for intraoperative analysis and was positive for renal cell carcinoma.  A separate deeper margin of tissue returned negative for malignancy. It was therefore decided to proceed with a partial nephrectomy considering his history of diabetes and the very low risk for recurrence. Cautery was used to ablate the base of the resection bed.  A running 2-0 V-lock suture was then brought through the capsule of the kidney and run along the base of the renal defect to provide hemostasis and close any entry into the renal collecting system if present. Weck clips were used to secure this suture outside the renal capsule at the proximal and distal ends. Floseal was then placed into the renal defect and the renal parenchyma was closed with a 2-0 V-lock horizontal mattress capsular suture which resulted in excellent compression of the renal defect.    The bulldog clamps were then removed from the renal hilar vessel(s) and an additional 12.5 g of IV mannitol was administered. Total warm renal ischemia time was 30 minutes. The renal tumor resection site was examined. Hemostasis appeared adequate.   The kidney was placed back into it normal anatomic position and covered with perinephric fat as needed.  A # 15 Blake drain was then brought through the lateral lower port site and positioned in the perinephric space.  It was secured to the skin with a nylon suture.  The surgical cart was undocked.  The renal tumor specimen was removed intact within an endopouch retrieval bag via the camera port sites.  The camera port site and the other 12 mm port site were then closed at the fascial level with 0-vicryl suture.  All other laparoscopic/robotic ports were removed under direct vision and the pneumoperitoneum let down with inspection of the operative field performed and hemostasis again confirmed. All incision sites were then injected with local anesthetic and reapproximated at the skin level with 4-0 monocryl subcuticular closures.  Dermabond was applied to the skin.  The patient tolerated the procedure well and without complications.  The patient was able to be extubated and transferred to the recovery unit in satisfactory condition.  This procedure was increased in its complexity due to the patient's obesity and anatomy and took more than 50% of the expected time to complete the procedure due to these factors.  Moody Bruins MD

## 2011-10-31 NOTE — Transfer of Care (Signed)
Immediate Anesthesia Transfer of Care Note  Patient: Derek [name]  Procedure(s) Performed:  ROBOTIC ASSISTED LAPAROSCOPIC NEPHRECTOMY - Right Robotic Assisted Laparoscopic Partial Nephrectomy  Patient Location: PACU  Anesthesia Type: General  Level of Consciousness: awake, alert , oriented and patient cooperative  Airway & Oxygen Therapy: Patient Spontanous Breathing and Patient connected to face mask oxygen  Post-op Assessment: Report given to PACU RN and Post -op Vital signs reviewed and stable  Post vital signs: Reviewed and stable  Complications: No apparent anesthesia complications

## 2011-11-01 ENCOUNTER — Encounter (HOSPITAL_COMMUNITY): Payer: Self-pay | Admitting: Urology

## 2011-11-01 LAB — GLUCOSE, CAPILLARY
Glucose-Capillary: 149 mg/dL — ABNORMAL HIGH (ref 70–99)
Glucose-Capillary: 159 mg/dL — ABNORMAL HIGH (ref 70–99)

## 2011-11-01 LAB — BASIC METABOLIC PANEL
CO2: 30 mEq/L (ref 19–32)
Chloride: 100 mEq/L (ref 96–112)
GFR calc non Af Amer: >90 mL/min (ref >90)
Glucose, Bld: 122 mg/dL — ABNORMAL HIGH (ref 70–99)
Potassium: 4.3 mEq/L (ref 3.5–5.1)
Sodium: 135 mEq/L (ref 135–145)

## 2011-11-01 LAB — HEMOGLOBIN AND HEMATOCRIT, BLOOD: HCT: 40.8 % (ref 39.0–52.0)

## 2011-11-01 MED ORDER — INSULIN ASPART 100 UNIT/ML ~~LOC~~ SOLN
0.0000 [IU] | Freq: Three times a day (TID) | SUBCUTANEOUS | Status: DC
  Administered 2011-11-02 (×2): 3 [IU] via SUBCUTANEOUS
  Filled 2011-11-01: qty 3

## 2011-11-01 MED ORDER — INSULIN ASPART 100 UNIT/ML ~~LOC~~ SOLN: 0.0000 [IU] | Freq: Every day | SUBCUTANEOUS | Status: DC

## 2011-11-01 MED ORDER — HYDROCODONE-ACETAMINOPHEN 5-325 MG PO TABS
1.0000 | ORAL_TABLET | Freq: Four times a day (QID) | ORAL | Status: DC | PRN
  Administered 2011-11-01 – 2011-11-02 (×4): 2 via ORAL
  Filled 2011-11-01 (×4): qty 2

## 2011-11-01 MED ORDER — BISACODYL 10 MG RE SUPP
10.0000 mg | Freq: Once | RECTAL | Status: AC
  Administered 2011-11-01: 10 mg via RECTAL
  Filled 2011-11-01: qty 1

## 2011-11-01 NOTE — Progress Notes (Signed)
Attempted to ambulate patient for second time.  Patient says he feels too dizzy to walk.  Reinforced education about the positive benefits of ambulating and patient verbalized his understanding and stated he would try later.  Will continue to monitor patient.  Dr. Laverle Patter notified.

## 2011-11-01 NOTE — Progress Notes (Signed)
Nurse Tech attempted to ambulate patient.  When patient sat up on side of bed, he began to to feel lightheaded and dizzy.  Vital signs are stable.  Will continue to monitor patient and try to ambulate patient this afternoon.

## 2011-11-01 NOTE — Discharge Summary (Signed)
  Date of admission: 10/31/2011  Date of discharge: 11/02/11  Admission diagnosis: right renal neoplasm  Discharge diagnosis: same  Secondary diagnoses: morbid obesity, asthma, OSA, nephrolithiasis, DM, hyperlipidemia, and Bosniak II left renal cyst.  History and Physical: For full details, please see admission history and physical. Briefly, Derek [name] is a 55 y.o. year old patient with a right renal neoplasm concerning for malignancy, therefore, surgical resection was scheduled.  Hospital Course: Pt was admitted and taken to the OR on 10/31/11 for a robotic right partial nephrectomy.  Pt tolerated procedure well and was hemodynamically stable immediately post operatively.  He was extubated without complication and woke up from anesthesia neurologically intact.  He was transferred from the OR to the PACU in stable condition.  The patients post op course has progressed as expected.  His foley was discontinued and he has been able to void without difficulty. JP drain was d/c'd successfully as well.  His tolerating a regular diet and has had a BM post op.  His pain is well controlled with oral pain medications.  He is ambulating without difficulty.  He has remained afebrile with stable vital signs throughout the hospital course. His JP cr was 0.8 consistent with serum indicating no evidence for urine leak. His drain was removed. On POD #2 pt is in stable condition and felt ready for d/c home.   PATH:  pending  Laboratory values:  Lab Results  Component Value Date   WBC 6.4 10/24/2011   HGB 13.3 11/01/2011   HCT 40.8 11/01/2011   MCV 87.0 10/24/2011   PLT 244 10/24/2011   Lab Results  Component Value Date   CREATININE 0.97 11/01/2011    Disposition: Home  Discharge instruction: The patient was instructed to be ambulatory but told to refrain from heavy lifting, strenuous activity, or driving.    Discharge medications:  Medication List  As of 11/01/2011  8:43 AM   START taking  these medications         HYDROcodone-acetaminophen 5-325 MG per tablet   Commonly known as: NORCO   Take 1-2 tablets by mouth every 6 (six) hours as needed for pain.         CONTINUE taking these medications         cetirizine-pseudoephedrine 5-120 MG per tablet   Commonly known as: ZYRTEC-D      Fluticasone-Salmeterol 100-50 MCG/DOSE Aepb   Commonly known as: ADVAIR      metFORMIN 500 MG tablet   Commonly known as: GLUCOPHAGE      multivitamins ther. w/minerals Tabs      oxymetazoline 0.05 % nasal spray   Commonly known as: AFRIN      RHINOCORT AQUA 32 MCG/ACT nasal spray   Generic drug: budesonide      simvastatin 20 MG tablet   Commonly known as: ZOCOR          Where to get your medications    These are the prescriptions that you need to pick up.   You may get these medications from any pharmacy.         HYDROcodone-acetaminophen 5-325 MG per tablet            Followup:  Follow-up Information    Follow up with Crecencio Mc, MD on 11/29/2011. (10:30)    Contact information:   [address] [place], [address] [facility] [place] [address] [phone]

## 2011-11-01 NOTE — Plan of Care (Signed)
Problem: Phase I Progression Outcomes Goal: Initial discharge plan identified Outcome: Completed/Met Date Met:  11/01/11 Pt plans d/c to Home, appropriate for condition

## 2011-11-01 NOTE — Progress Notes (Signed)
Patient ID: Derek [name], male   DOB: 18-Mar-1956, 55 y.o.   MRN: [id number] 1 Day Post-Op Subjective: The patient is doing well.  No nausea or vomiting. Pain is adequately controlled.  Objective: Vital signs in last 24 hours: Temp:  [97.1 F (36.2 C)-99.2 F (37.3 C)] 98.5 F (36.9 C) (11/20 0430) Pulse Rate:  [67-90] 78  (11/20 0430) Resp:  [18-27] 20  (11/20 0430) BP: (117-159)/(67-91) 122/76 mmHg (11/20 0430) SpO2:  [20 %-98 %] 98 % (11/20 0430) Weight:  [136.9 kg (301 lb 13 oz)-139.254 kg (307 lb)] 301 lb 13 oz (136.9 kg) (11/19 1830)  Intake/Output from previous day: 11/19 0701 - 11/20 0700 In: 5695 [I.V.:5155; IV Piggyback:300] Out: 1755 [Urine:1385; Drains:220; Blood:150] Intake/Output this shift:    Physical Exam:  General: Alert and oriented. CV: RRR Lungs: Clear bilaterally. GI: Soft, Nondistended. Incisions: Clean and dry. Urine: Pink tinged Extremities: Nontender, no erythema, no edema.  Lab Results:  Basename 11/01/11 0454 10/31/11 1732  HGB 13.3 14.1  HCT 40.8 43.2          Basename 11/01/11 0454 10/31/11 1732  CREATININE 0.97 0.90           Assessment/Plan: POD# 1 s/p robotic partial nephrectomy.  1) Ambulate, Incentive spirometry (beginning this afternoon) 2) Advance diet as tolerated 3) Transition to oral pain medication 4) Dulcolax suppository 5) D/C urethral catheter   Moody Bruins. MD   LOS: 1 day   Katia Hannen,LES 11/01/2011, 7:36 AM

## 2011-11-02 LAB — GLUCOSE, CAPILLARY: Glucose-Capillary: 135 mg/dL — ABNORMAL HIGH (ref 70–99)

## 2011-11-02 LAB — CREATININE, FLUID (PLEURAL, PERITONEAL, JP DRAINAGE): Creat, Fluid: 0.8 mg/dL

## 2011-11-02 MED ORDER — HYDROCODONE-ACETAMINOPHEN 5-325 MG PO TABS: 1.0000 | ORAL_TABLET | ORAL | Status: AC | PRN

## 2011-11-02 NOTE — Progress Notes (Signed)
Patient ID: Derek [name], male   DOB: 01-10-1956, 55 y.o.   MRN: [id number] 2 Days Post-Op Subjective: The patient is doing well.  No nausea or vomiting. Tolerating regular diet. Pain is fairly well controlled with po pain meds now.  He was able to ambulate last night.  He initially felt dizzy but this subsided.  Positive BM.  Objective: Vital signs in last 24 hours: Temp:  [98 F (36.7 C)-99.4 F (37.4 C)] 98.7 F (37.1 C) (11/21 0532) Pulse Rate:  [64-87] 79  (11/21 0532) Resp:  [18-21] 21  (11/21 0532) BP: (105-136)/(61-84) 136/84 mmHg (11/21 0532) SpO2:  [87 %-96 %] 95 % (11/21 0532)  Intake/Output from previous day: 11/20 0701 - 11/21 0700 In: 1907.5 [P.O.:240; I.V.:1497.5; IV Piggyback:100] Out: 2181 [Urine:2150; Drains:31] Intake/Output this shift:    Physical Exam:  General: Alert and oriented. CV: RRR Lungs: Clear bilaterally. GI: Soft, Nondistended. Incisions: Clean and dry. Extremities: Nontender, no erythema, no edema.  Lab Results:  Basename 11/01/11 0454 10/31/11 1732  HGB 13.3 14.1  HCT 40.8 43.2          Basename 11/01/11 0454 10/31/11 1732  CREATININE 0.97 0.90           Results for orders placed during the hospital encounter of 10/31/11 (from the past 24 hour(s))  GLUCOSE, CAPILLARY     Status: Abnormal   Collection Time   11/01/11  7:39 AM      Component Value Range   Glucose-Capillary 114 (*) 70 - 99 (mg/dL)  GLUCOSE, CAPILLARY     Status: Abnormal   Collection Time   11/01/11 11:45 AM      Component Value Range   Glucose-Capillary 149 (*) 70 - 99 (mg/dL)  GLUCOSE, CAPILLARY     Status: Abnormal   Collection Time   11/01/11  5:02 PM      Component Value Range   Glucose-Capillary 159 (*) 70 - 99 (mg/dL)    Assessment/Plan: POD# 2 s/p robotic partial nephrectomy.  1) Ambulate, Incentive spirometry 2) Check drain creatinine level 3) Sl IVF 4) D/C home if doing well later today  Moody Bruins. MD   LOS: 2 days    Derek [name],LES 11/02/2011, 7:13 AM

## 2012-01-23 ENCOUNTER — Ambulatory Visit (INDEPENDENT_AMBULATORY_CARE_PROVIDER_SITE_OTHER): Payer: BC Managed Care – PPO | Admitting: Surgery

## 2012-01-23 ENCOUNTER — Encounter (INDEPENDENT_AMBULATORY_CARE_PROVIDER_SITE_OTHER): Payer: Self-pay | Admitting: Surgery

## 2012-01-23 VITALS — BP 150/90 | HR 80 | Temp 98.6°F | Resp 12 | Ht 68.0 in | Wt 307.0 lb

## 2012-01-23 DIAGNOSIS — K432 Incisional hernia without obstruction or gangrene: Secondary | ICD-10-CM

## 2012-01-23 NOTE — Patient Instructions (Signed)
Hernia °A hernia occurs when an internal organ pushes out through a weak spot in the abdominal wall. Hernias most commonly occur in the groin and around the navel. Hernias often can be pushed back into place (reduced). Most hernias tend to get worse over time. Some abdominal hernias can get stuck in the opening (irreducible or incarcerated hernia) and cannot be reduced. An irreducible abdominal hernia which is tightly squeezed into the opening is at risk for impaired blood supply (strangulated hernia). A strangulated hernia is a medical emergency. Because of the risk for an irreducible or strangulated hernia, surgery may be recommended to repair a hernia. °CAUSES  °· Heavy lifting.  °· Prolonged coughing.  °· Straining to have a bowel movement.  °· A cut (incision) made during an abdominal surgery.  °HOME CARE INSTRUCTIONS  °· Bed rest is not required. You may continue your normal activities.  °· Avoid lifting more than 10 pounds (4.5 kg) or straining.  °· Cough gently. If you are a smoker it is best to stop. Even the best hernia repair can break down with the continual strain of coughing. Even if you do not have your hernia repaired, a cough will continue to aggravate the problem.  °· Do not wear anything tight over your hernia. Do not try to keep it in with an outside bandage or truss. These can damage abdominal contents if they are trapped within the hernia sac.  °· Eat a normal diet.  °· Avoid constipation. Straining over long periods of time will increase hernia size and encourage breakdown of repairs. If you cannot do this with diet alone, stool softeners may be used.  °SEEK IMMEDIATE MEDICAL CARE IF:  °· You have a fever.  °· You develop increasing abdominal pain.  °· You feel nauseous or vomit.  °· Your hernia is stuck outside the abdomen, looks discolored, feels hard, or is tender.  °· You have any changes in your bowel habits or in the hernia that are unusual for you.  °· You have increased pain or  swelling around the hernia.  °· You cannot push the hernia back in place by applying gentle pressure while lying down.  °MAKE SURE YOU:  °· Understand these instructions.  °· Will watch your condition.  °· Will get help right away if you are not doing well or get worse.  °Document Released: 11/28/2005 Document Revised: 08/10/2011 Document Reviewed: 07/17/2008 °ExitCare® Patient Information ©2012 [employer]. °

## 2012-01-23 NOTE — Progress Notes (Signed)
Patient ID: Derek [name], male   DOB: July 16, 1956, 56 y.o.   MRN: [id number]  Chief Complaint  Patient presents with  . Incisional Hernia    EVALUATE FOR INCISIONAL HERNAI    HPI Derek [name] is a 56 y.o. male.   HPI The patient presents at the request of Dr. Laverle Patter for a bulge in his right abdomen. He had a laparoscopic assisted partial nephrectomy in November. His extraction site was the right midabdomen. He's develop a bulge there. He is not having a significant pain. He is able to exercise and lifts weights without difficulty. He has no nausea or vomiting.  Past Medical History  Diagnosis Date  . Asthma     asthma type symptoms   . Diabetes mellitus     on po meds well controlled per pt   . Chronic kidney disease     right renal neoplasm   . Arthritis     hx of in left hand and thumb surgery to correct   . Cancer     right renla neoplasm     Past Surgical History  Procedure Date  . Tonsillectomy     age 48   . Other surgical history     surgery to correct  sleep apnea   . Other surgical history     surgery to correct arthritis in left hand and thumb   . Other surgical history     kidney stoen surgery   . Other surgical history     laser eye surgery   . Robot assisted laparoscopic nephrectomy 10/31/2011    Procedure: ROBOTIC ASSISTED LAPAROSCOPIC NEPHRECTOMY;  Surgeon: Crecencio Mc, MD;  Location: [facility];  Service: Urology;  Laterality: Right;  Right Robotic Assisted Laparoscopic Partial Nephrectomy    No family history on file.  Social History History  Substance Use Topics  . Smoking status: Former Smoker -- 20 years    Quit date: 12/23/1993  . Smokeless tobacco: Not on file  . Alcohol Use: 1.8 oz/week    3 Cans of beer per week    Allergies  Allergen Reactions  . Biaxin (Clarithromycin Er) Rash  . Sulfa Antibiotics Rash    Current Outpatient Prescriptions  Medication Sig Dispense Refill  . budesonide (RHINOCORT AQUA) 32 MCG/ACT nasal spray  Place 2 sprays into the nose 2 (two) times daily.       . cetirizine-pseudoephedrine (ZYRTEC-D) 5-120 MG per tablet Take 1 tablet by mouth 2 (two) times daily.       . Fluticasone-Salmeterol (ADVAIR) 100-50 MCG/DOSE AEPB Inhale 1 puff into the lungs every 12 (twelve) hours.       . metFORMIN (GLUCOPHAGE) 500 MG tablet Take 500 mg by mouth 2 (two) times daily with a meal.       . Multiple Vitamins-Minerals (MULTIVITAMINS THER. W/MINERALS) TABS Take 1 tablet by mouth daily.       Marland Kitchen oxymetazoline (AFRIN) 0.05 % nasal spray Place 2 sprays into the nose 2 (two) times daily as needed. Allergies       . simvastatin (ZOCOR) 20 MG tablet Take 20 mg by mouth at bedtime.         Review of Systems Review of Systems  Constitutional: Negative for fever, chills and unexpected weight change.  HENT: Negative for hearing loss, congestion, sore throat, trouble swallowing and voice change.   Eyes: Negative for visual disturbance.  Respiratory: Negative for cough and wheezing.   Cardiovascular: Negative for chest pain, palpitations and leg swelling.  Gastrointestinal: Negative for nausea, vomiting, abdominal pain, diarrhea, constipation, blood in stool, abdominal distention, anal bleeding and rectal pain.  Genitourinary: Negative for hematuria and difficulty urinating.  Musculoskeletal: Negative for arthralgias.  Skin: Negative for rash and wound.  Neurological: Negative for seizures, syncope, weakness and headaches.  Hematological: Negative for adenopathy. Does not bruise/bleed easily.  Psychiatric/Behavioral: Negative for confusion.    Blood pressure 150/90, pulse 80, temperature 98.6 F (37 C), resp. rate 12, height 5\' 8"  (1.727 m), weight 307 lb (139.254 kg).  Physical Exam Physical Exam  Constitutional: He appears well-developed and well-nourished.  HENT:  Head: Normocephalic and atraumatic.  Eyes: EOM are normal. Pupils are equal, round, and reactive to light.  Neck: Normal range of motion. Neck  supple.  Cardiovascular: Normal rate and regular rhythm.   Pulmonary/Chest: Effort normal and breath sounds normal.  Abdominal:    Skin: Skin is warm, dry and intact.  Psychiatric: He has a normal mood and affect. His speech is normal and behavior is normal. Judgment and thought content normal. Cognition and memory are normal.    Data Reviewed Dr Laverle Patter' s notes  Assessment    Incisional hernia    Plan    Will need repair.  Probably open the best approach.  Will return in April to schedule.  Information about hernia given.       Kushi Kun A. 01/23/2012, 3:53 PM

## 2012-03-19 ENCOUNTER — Encounter (INDEPENDENT_AMBULATORY_CARE_PROVIDER_SITE_OTHER): Payer: Self-pay | Admitting: Surgery

## 2012-03-19 ENCOUNTER — Ambulatory Visit (INDEPENDENT_AMBULATORY_CARE_PROVIDER_SITE_OTHER): Payer: BC Managed Care – PPO | Admitting: Surgery

## 2012-03-19 VITALS — BP 140/90 | HR 80 | Ht 68.0 in | Wt 308.6 lb

## 2012-03-19 DIAGNOSIS — K432 Incisional hernia without obstruction or gangrene: Secondary | ICD-10-CM

## 2012-03-19 NOTE — Patient Instructions (Signed)
You will be scheduled for surgery.

## 2012-03-19 NOTE — Progress Notes (Signed)
Patient ID: Derek [name], male   DOB: 12/01/1956, 55 y.o.   MRN: [id number]  No chief complaint on file.   HPI Derek [name] is a 55 y.o. male.   HPI The patient presents at the request of Dr. Borden for a bulge in his right abdomen. He had a laparoscopic assisted partial nephrectomy in November. His extraction site was the right midabdomen. He's develop a bulge there. He is not having a significant pain. He is able to exercise and lifts weights without difficulty. He has no nausea or vomiting.  Past Medical History  Diagnosis Date  . Asthma     asthma type symptoms   . Diabetes mellitus     on po meds well controlled per pt   . Chronic kidney disease     right renal neoplasm   . Arthritis     hx of in left hand and thumb surgery to correct   . Cancer     right renla neoplasm     Past Surgical History  Procedure Date  . Tonsillectomy     age 8   . Other surgical history     surgery to correct  sleep apnea   . Other surgical history     surgery to correct arthritis in left hand and thumb   . Other surgical history     kidney stoen surgery   . Other surgical history     laser eye surgery   . Robot assisted laparoscopic nephrectomy 10/31/2011    Procedure: ROBOTIC ASSISTED LAPAROSCOPIC NEPHRECTOMY;  Surgeon: Les Borden, MD;  Location: [facility];  Service: Urology;  Laterality: Right;  Right Robotic Assisted Laparoscopic Partial Nephrectomy    No family history on file.  Social History History  Substance Use Topics  . Smoking status: Former Smoker -- 20 years    Quit date: 12/23/1993  . Smokeless tobacco: Not on file  . Alcohol Use: 1.8 oz/week    3 Cans of beer per week    Allergies  Allergen Reactions  . Biaxin (Clarithromycin Er) Rash  . Sulfa Antibiotics Rash    Current Outpatient Prescriptions  Medication Sig Dispense Refill  . budesonide (RHINOCORT AQUA) 32 MCG/ACT nasal spray Place 2 sprays into the nose 2 (two) times daily.       .  cetirizine-pseudoephedrine (ZYRTEC-D) 5-120 MG per tablet Take 1 tablet by mouth 2 (two) times daily.       . Fluticasone-Salmeterol (ADVAIR) 100-50 MCG/DOSE AEPB Inhale 1 puff into the lungs every 12 (twelve) hours.       . metFORMIN (GLUCOPHAGE) 500 MG tablet Take 500 mg by mouth 2 (two) times daily with a meal.       . Multiple Vitamins-Minerals (MULTIVITAMINS THER. W/MINERALS) TABS Take 1 tablet by mouth daily.       . oxymetazoline (AFRIN) 0.05 % nasal spray Place 2 sprays into the nose 2 (two) times daily as needed. Allergies       . simvastatin (ZOCOR) 20 MG tablet Take 20 mg by mouth at bedtime.         Review of Systems Review of Systems  Constitutional: Negative for fever, chills and unexpected weight change.  HENT: Negative for hearing loss, congestion, sore throat, trouble swallowing and voice change.   Eyes: Negative for visual disturbance.  Respiratory: Negative for cough and wheezing.   Cardiovascular: Negative for chest pain, palpitations and leg swelling.  Gastrointestinal: Negative for nausea, vomiting, abdominal pain, diarrhea, constipation, blood in stool, abdominal   distention, anal bleeding and rectal pain.  Genitourinary: Negative for hematuria and difficulty urinating.  Musculoskeletal: Negative for arthralgias.  Skin: Negative for rash and wound.  Neurological: Negative for seizures, syncope, weakness and headaches.  Hematological: Negative for adenopathy. Does not bruise/bleed easily.  Psychiatric/Behavioral: Negative for confusion.    Blood pressure 140/90, pulse 80, height 5' 8" (1.727 m), weight 308 lb 9.6 oz (139.98 kg).  Physical Exam Physical Exam  Constitutional: He appears well-developed and well-nourished.  HENT:  Head: Normocephalic and atraumatic.  Eyes: EOM are normal. Pupils are equal, round, and reactive to light.  Neck: Normal range of motion. Neck supple.  Cardiovascular: Normal rate and regular rhythm.   Pulmonary/Chest: Effort normal and  breath sounds normal.  Abdominal:    Skin: Skin is warm, dry and intact.  Psychiatric: He has a normal mood and affect. His speech is normal and behavior is normal. Judgment and thought content normal. Cognition and memory are normal.    Data Reviewed Dr Borden' s notes  Assessment    Incisional hernia    Plan    Will need repair.  Probably open the best approach. The risk of hernia repair include bleeding,  Infection,   Recurrence of the hernia,  Mesh use, chronic pain,  Organ injury,  Bowel injury,  Bladder injury,   nerve injury with numbness around the incision,  Death,  and worsening of preexisting  medical problems.  The alternatives to surgery have been discussed as well..  Long term expectations of both operative and non operative treatments have been discussed.   The patient agrees to proceed.       Nikki Glanzer A. 03/19/2012, 9:24 AM    

## 2012-03-28 ENCOUNTER — Encounter (HOSPITAL_COMMUNITY): Payer: Self-pay | Admitting: Pharmacy Technician

## 2012-04-05 ENCOUNTER — Encounter (HOSPITAL_COMMUNITY): Payer: Self-pay

## 2012-04-05 ENCOUNTER — Encounter (HOSPITAL_COMMUNITY)
Admission: RE | Admit: 2012-04-05 | Discharge: 2012-04-05 | Disposition: A | Discharge status: Home / Self Care | Payer: BC Managed Care – PPO | Source: Ambulatory Visit | Attending: Surgery | Admitting: Surgery

## 2012-04-05 HISTORY — DX: Sleep apnea, unspecified: G47.30

## 2012-04-05 HISTORY — DX: Other seasonal allergic rhinitis: J30.2

## 2012-04-05 LAB — CBC
MCH: 29 pg (ref 26.0–34.0)
MCHC: 33.7 g/dL (ref 30.0–36.0)
MCV: 86.1 fL (ref 78.0–100.0)
Platelets: 239 10*3/uL (ref 150–400)
RDW: 14.8 % (ref 11.5–15.5)

## 2012-04-05 LAB — COMPREHENSIVE METABOLIC PANEL
AST: 22 U/L (ref 0–37)
Albumin: 4.2 g/dL (ref 3.5–5.2)
CO2: 29 mEq/L (ref 19–32)
Calcium: 10 mg/dL (ref 8.4–10.5)
Creatinine, Ser: 0.96 mg/dL (ref 0.50–1.35)
GFR calc non Af Amer: >90 mL/min (ref >90)
Total Protein: 7.5 g/dL (ref 6.0–8.3)

## 2012-04-05 LAB — DIFFERENTIAL
Basophils Absolute: 0.1 10*3/uL (ref 0.0–0.1)
Lymphocytes Relative: 24 % (ref 12–46)
Monocytes Absolute: 0.9 10*3/uL (ref 0.1–1.0)
Neutro Abs: 6 10*3/uL (ref 1.7–7.7)

## 2012-04-05 NOTE — Pre-Procedure Instructions (Signed)
22 Derek [name]  04/05/2012   Your procedure is scheduled on:  Thursday, May 2nd.  Report to [facility] at 5:30AM.  Call this number if you have problems the morning of surgery: [phone]   Remember:   Do not eat food:After Midnight.  May have clear liquids: up to 4 Hours before arrival.  (1:30)  Clear liquids include soda, tea, black coffee, apple or grape juice, broth.    Take these medicines the morning of surgery with A SIP OF WATER: No medications by mouth.  May use nasal spray and inhaler.          Discontinue aspirin, aspirin products, Coumadin, Plavix, Effient, NSAIDS and Herbal Products.   Do not wear jewelry, make-up or nail polish.  Do not wear lotions, powders, or perfumes. You may wear deodorant.  Do not shave 48 hours prior to surgery.  Do not bring valuables to the hospital.  Contacts, dentures or bridgework may not be worn into surgery.  Leave suitcase in the car. After surgery it may be brought to your room.  For patients admitted to the hospital, checkout time is 11:00 AM the day of discharge.   Patients discharged the day of surgery will not be allowed to drive home.  Name and phone number of your driver: --  Special Instructions: CHG Shower Use Special Wash: 1/2 bottle night before surgery and 1/2 bottle morning of surgery.   Please read over the following fact sheets that you were given:

## 2012-04-11 MED ORDER — CEFAZOLIN SODIUM-DEXTROSE 2-3 GM-% IV SOLR
2.0000 g | INTRAVENOUS | Status: DC
  Filled 2012-04-11: qty 50

## 2012-04-11 MED ORDER — CHLORHEXIDINE GLUCONATE 4 % EX LIQD: 1.0000 "application " | Freq: Once | CUTANEOUS | Status: DC

## 2012-04-12 ENCOUNTER — Encounter (HOSPITAL_COMMUNITY): Payer: Self-pay | Admitting: Anesthesiology

## 2012-04-12 ENCOUNTER — Encounter (HOSPITAL_COMMUNITY): Payer: Self-pay

## 2012-04-12 ENCOUNTER — Encounter (HOSPITAL_COMMUNITY)
Admission: RE | Disposition: A | Discharge status: Home / Self Care | Payer: Self-pay | Source: Ambulatory Visit | Attending: Surgery

## 2012-04-12 ENCOUNTER — Ambulatory Visit (HOSPITAL_COMMUNITY)
Admission: RE | Admit: 2012-04-12 | Discharge: 2012-04-13 | Disposition: A | Discharge status: Home / Self Care | Payer: BC Managed Care – PPO | Source: Ambulatory Visit | Attending: Surgery | Admitting: Surgery

## 2012-04-12 ENCOUNTER — Ambulatory Visit (HOSPITAL_COMMUNITY): Payer: BC Managed Care – PPO | Admitting: Anesthesiology

## 2012-04-12 ENCOUNTER — Encounter (HOSPITAL_COMMUNITY): Payer: Self-pay | Admitting: General Practice

## 2012-04-12 DIAGNOSIS — E119 Type 2 diabetes mellitus without complications: Secondary | ICD-10-CM | POA: Insufficient documentation

## 2012-04-12 DIAGNOSIS — K432 Incisional hernia without obstruction or gangrene: Secondary | ICD-10-CM | POA: Insufficient documentation

## 2012-04-12 DIAGNOSIS — J45909 Unspecified asthma, uncomplicated: Secondary | ICD-10-CM | POA: Insufficient documentation

## 2012-04-12 DIAGNOSIS — Z9889 Other specified postprocedural states: Secondary | ICD-10-CM

## 2012-04-12 DIAGNOSIS — G4730 Sleep apnea, unspecified: Secondary | ICD-10-CM | POA: Insufficient documentation

## 2012-04-12 DIAGNOSIS — Z85528 Personal history of other malignant neoplasm of kidney: Secondary | ICD-10-CM | POA: Insufficient documentation

## 2012-04-12 HISTORY — PX: INCISIONAL HERNIA REPAIR: SHX193

## 2012-04-12 HISTORY — PX: HERNIA REPAIR: SHX51

## 2012-04-12 LAB — GLUCOSE, CAPILLARY
Glucose-Capillary: 130 mg/dL — ABNORMAL HIGH (ref 70–99)
Glucose-Capillary: 132 mg/dL — ABNORMAL HIGH (ref 70–99)
Glucose-Capillary: 157 mg/dL — ABNORMAL HIGH (ref 70–99)
Glucose-Capillary: 221 mg/dL — ABNORMAL HIGH (ref 70–99)

## 2012-04-12 LAB — CBC
HCT: 44.6 % (ref 39.0–52.0)
Hemoglobin: 15.2 g/dL (ref 13.0–17.0)
MCH: 29.5 pg (ref 26.0–34.0)
MCHC: 34.1 g/dL (ref 30.0–36.0)
RBC: 5.16 MIL/uL (ref 4.22–5.81)

## 2012-04-12 LAB — HEMOGLOBIN A1C
Hgb A1c MFr Bld: 7 % — ABNORMAL HIGH (ref <5.7)
Mean Plasma Glucose: 154 mg/dL — ABNORMAL HIGH (ref <117)

## 2012-04-12 SURGERY — REPAIR, HERNIA, INCISIONAL
Anesthesia: General | Site: Abdomen | Wound class: Clean

## 2012-04-12 MED ORDER — INSULIN ASPART 100 UNIT/ML ~~LOC~~ SOLN
0.0000 [IU] | SUBCUTANEOUS | Status: DC
  Administered 2012-04-12: 7 [IU] via SUBCUTANEOUS
  Administered 2012-04-12: 3 [IU] via SUBCUTANEOUS
  Administered 2012-04-12 – 2012-04-13 (×2): 4 [IU] via SUBCUTANEOUS

## 2012-04-12 MED ORDER — GLYCOPYRROLATE 0.2 MG/ML IJ SOLN
INTRAMUSCULAR | Status: DC | PRN
  Administered 2012-04-12: .6 mg via INTRAVENOUS

## 2012-04-12 MED ORDER — FENTANYL CITRATE 0.05 MG/ML IJ SOLN
INTRAMUSCULAR | Status: DC | PRN
  Administered 2012-04-12 (×2): 50 ug via INTRAVENOUS
  Administered 2012-04-12 (×3): 100 ug via INTRAVENOUS

## 2012-04-12 MED ORDER — HYDROMORPHONE HCL PF 1 MG/ML IJ SOLN
0.2500 mg | INTRAMUSCULAR | Status: DC | PRN
  Administered 2012-04-12: 0.25 mg via INTRAVENOUS
  Administered 2012-04-12: 0.5 mg via INTRAVENOUS

## 2012-04-12 MED ORDER — ONDANSETRON HCL 4 MG PO TABS: 4.0000 mg | ORAL_TABLET | Freq: Four times a day (QID) | ORAL | Status: DC | PRN

## 2012-04-12 MED ORDER — FLUTICASONE-SALMETEROL 100-50 MCG/DOSE IN AEPB
1.0000 | INHALATION_SPRAY | Freq: Two times a day (BID) | RESPIRATORY_TRACT | Status: DC
  Administered 2012-04-12: 1 via RESPIRATORY_TRACT
  Filled 2012-04-12: qty 14

## 2012-04-12 MED ORDER — OXYCODONE-ACETAMINOPHEN 5-325 MG PO TABS
1.0000 | ORAL_TABLET | ORAL | Status: DC | PRN
  Administered 2012-04-12 – 2012-04-13 (×2): 2 via ORAL
  Filled 2012-04-12 (×2): qty 2

## 2012-04-12 MED ORDER — ROCURONIUM BROMIDE 100 MG/10ML IV SOLN
INTRAVENOUS | Status: DC | PRN
  Administered 2012-04-12: 10 mg via INTRAVENOUS
  Administered 2012-04-12: 5 mg via INTRAVENOUS
  Administered 2012-04-12 (×2): 10 mg via INTRAVENOUS
  Administered 2012-04-12: 5 mg via INTRAVENOUS
  Administered 2012-04-12: 50 mg via INTRAVENOUS
  Administered 2012-04-12: 10 mg via INTRAVENOUS

## 2012-04-12 MED ORDER — CEFAZOLIN SODIUM 1-5 GM-% IV SOLN
INTRAVENOUS | Status: DC | PRN
  Administered 2012-04-12: 2 g via INTRAVENOUS

## 2012-04-12 MED ORDER — ONDANSETRON HCL 4 MG/2ML IJ SOLN: 4.0000 mg | Freq: Four times a day (QID) | INTRAMUSCULAR | Status: DC | PRN

## 2012-04-12 MED ORDER — KCL IN DEXTROSE-NACL 10-5-0.45 MEQ/L-%-% IV SOLN
INTRAVENOUS | Status: DC
  Administered 2012-04-12 – 2012-04-13 (×2): via INTRAVENOUS
  Filled 2012-04-12 (×3): qty 1000

## 2012-04-12 MED ORDER — MIDAZOLAM HCL 5 MG/5ML IJ SOLN
INTRAMUSCULAR | Status: DC | PRN
  Administered 2012-04-12: 2 mg via INTRAVENOUS

## 2012-04-12 MED ORDER — 0.9 % SODIUM CHLORIDE (POUR BTL) OPTIME
TOPICAL | Status: DC | PRN
  Administered 2012-04-12: 1000 mL

## 2012-04-12 MED ORDER — CEFAZOLIN SODIUM 1-5 GM-% IV SOLN
1.0000 g | Freq: Three times a day (TID) | INTRAVENOUS | Status: AC
  Administered 2012-04-12: 1 g via INTRAVENOUS
  Filled 2012-04-12: qty 50

## 2012-04-12 MED ORDER — HYDROMORPHONE HCL PF 1 MG/ML IJ SOLN
1.0000 mg | INTRAMUSCULAR | Status: DC | PRN
  Administered 2012-04-12: 1 mg via INTRAVENOUS
  Filled 2012-04-12: qty 1

## 2012-04-12 MED ORDER — PROPOFOL 10 MG/ML IV EMUL
INTRAVENOUS | Status: DC | PRN
  Administered 2012-04-12: 200 mg via INTRAVENOUS

## 2012-04-12 MED ORDER — NEOSTIGMINE METHYLSULFATE 1 MG/ML IJ SOLN
INTRAMUSCULAR | Status: DC | PRN
  Administered 2012-04-12: 4 mg via INTRAVENOUS

## 2012-04-12 MED ORDER — METFORMIN HCL 500 MG PO TABS
500.0000 mg | ORAL_TABLET | Freq: Two times a day (BID) | ORAL | Status: DC
  Administered 2012-04-12 – 2012-04-13 (×2): 500 mg via ORAL
  Filled 2012-04-12 (×4): qty 1

## 2012-04-12 MED ORDER — ONDANSETRON HCL 4 MG/2ML IJ SOLN
INTRAMUSCULAR | Status: DC | PRN
  Administered 2012-04-12 (×2): 4 mg via INTRAVENOUS

## 2012-04-12 MED ORDER — BUPIVACAINE LIPOSOME 1.3 % IJ SUSP
20.0000 mL | INTRAMUSCULAR | Status: AC
  Administered 2012-04-12: 20 mL
  Filled 2012-04-12: qty 20

## 2012-04-12 MED ORDER — SODIUM CHLORIDE 0.9 % IJ SOLN
INTRAMUSCULAR | Status: DC | PRN
  Administered 2012-04-12: 20 mL

## 2012-04-12 MED ORDER — LACTATED RINGERS IV SOLN
INTRAVENOUS | Status: DC | PRN
  Administered 2012-04-12 (×2): via INTRAVENOUS

## 2012-04-12 MED ORDER — ENOXAPARIN SODIUM 40 MG/0.4ML ~~LOC~~ SOLN
40.0000 mg | SUBCUTANEOUS | Status: DC
  Administered 2012-04-13: 40 mg via SUBCUTANEOUS
  Filled 2012-04-12 (×2): qty 0.4

## 2012-04-12 MED ORDER — SIMVASTATIN 20 MG PO TABS
20.0000 mg | ORAL_TABLET | Freq: Every day | ORAL | Status: DC
  Administered 2012-04-12: 20 mg via ORAL
  Filled 2012-04-12 (×2): qty 1

## 2012-04-12 SURGICAL SUPPLY — 63 items
3-0 VICRYL TIES ×1 IMPLANT
ADH SKN CLS APL DERMABOND .7 (GAUZE/BANDAGES/DRESSINGS)
BALL CTTN LRG ABS STRL LF (GAUZE/BANDAGES/DRESSINGS)
BLADE SURG 10 STRL SS (BLADE) ×2 IMPLANT
BLADE SURG 15 STRL LF DISP TIS (BLADE) ×1 IMPLANT
BLADE SURG 15 STRL SS (BLADE) ×2
CANISTER SUCTION 2500CC (MISCELLANEOUS) ×2 IMPLANT
CHLORAPREP W/TINT 26ML (MISCELLANEOUS) ×2 IMPLANT
CLOTH BEACON ORANGE TIMEOUT ST (SAFETY) ×2 IMPLANT
COTTONBALL LRG STERILE PKG (GAUZE/BANDAGES/DRESSINGS) ×1 IMPLANT
COVER SURGICAL LIGHT HANDLE (MISCELLANEOUS) ×2 IMPLANT
DECANTER SPIKE VIAL GLASS SM (MISCELLANEOUS) ×4 IMPLANT
DERMABOND ADVANCED (GAUZE/BANDAGES/DRESSINGS)
DERMABOND ADVANCED .7 DNX12 (GAUZE/BANDAGES/DRESSINGS) ×1 IMPLANT
DRAIN CHANNEL 19F RND (DRAIN) ×1 IMPLANT
DRAPE LAPAROSCOPIC ABDOMINAL (DRAPES) ×2 IMPLANT
DRAPE UTILITY 15X26 W/TAPE STR (DRAPE) ×4 IMPLANT
ELECT CAUTERY BLADE 6.4 (BLADE) ×2 IMPLANT
ELECT REM PT RETURN 9FT ADLT (ELECTROSURGICAL) ×2
ELECTRODE REM PT RTRN 9FT ADLT (ELECTROSURGICAL) ×1 IMPLANT
EVACUATOR SILICONE 100CC (DRAIN) ×1 IMPLANT
GAUZE SPONGE 4X4 16PLY XRAY LF (GAUZE/BANDAGES/DRESSINGS) ×2 IMPLANT
GAUZE XEROFORM 5X9 LF (GAUZE/BANDAGES/DRESSINGS) ×1 IMPLANT
GLOVE BIO SURGEON STRL SZ8 (GLOVE) ×2 IMPLANT
GLOVE BIOGEL PI IND STRL 6.5 (GLOVE) IMPLANT
GLOVE BIOGEL PI IND STRL 7.0 (GLOVE) IMPLANT
GLOVE BIOGEL PI IND STRL 7.5 (GLOVE) IMPLANT
GLOVE BIOGEL PI IND STRL 8 (GLOVE) ×1 IMPLANT
GLOVE BIOGEL PI INDICATOR 6.5 (GLOVE) ×1
GLOVE BIOGEL PI INDICATOR 7.0 (GLOVE) ×2
GLOVE BIOGEL PI INDICATOR 7.5 (GLOVE) ×1
GLOVE BIOGEL PI INDICATOR 8 (GLOVE) ×1
GLOVE ECLIPSE 6.5 STRL STRAW (GLOVE) ×1 IMPLANT
GLOVE SS N UNI LF 7.0 STRL (GLOVE) ×2 IMPLANT
GOWN STRL NON-REIN LRG LVL3 (GOWN DISPOSABLE) ×6 IMPLANT
KIT BASIN OR (CUSTOM PROCEDURE TRAY) ×2 IMPLANT
KIT ROOM TURNOVER OR (KITS) ×2 IMPLANT
MESH ULTRAPRO 6X6 15CM15CM (Mesh General) ×1 IMPLANT
NDL HYPO 25GX1X1/2 BEV (NEEDLE) ×1 IMPLANT
NEEDLE HYPO 25GX1X1/2 BEV (NEEDLE) ×2 IMPLANT
NS IRRIG 1000ML POUR BTL (IV SOLUTION) ×2 IMPLANT
PACK SURGICAL SETUP 50X90 (CUSTOM PROCEDURE TRAY) ×2 IMPLANT
PAD ARMBOARD 7.5X6 YLW CONV (MISCELLANEOUS) ×4 IMPLANT
PENCIL BUTTON HOLSTER BLD 10FT (ELECTRODE) ×2 IMPLANT
SPONGE GAUZE 4X4 12PLY (GAUZE/BANDAGES/DRESSINGS) ×2 IMPLANT
STAPLER VISISTAT 35W (STAPLE) ×1 IMPLANT
SUT ETHILON 2 0 FS 18 (SUTURE) ×1 IMPLANT
SUT MON AB 4-0 PC3 18 (SUTURE) ×2 IMPLANT
SUT NOVA NAB DX-16 0-1 5-0 T12 (SUTURE) ×3 IMPLANT
SUT NOVA T20/GS 25 (SUTURE) ×3 IMPLANT
SUT PDS AB 1 CT  36 (SUTURE) ×4
SUT PDS AB 1 CT 36 (SUTURE) IMPLANT
SUT VIC AB 0 CT1 27 (SUTURE) ×2
SUT VIC AB 0 CT1 27XBRD ANBCTR (SUTURE) IMPLANT
SUT VIC AB 3-0 SH 27 (SUTURE) ×2
SUT VIC AB 3-0 SH 27X BRD (SUTURE) ×1 IMPLANT
SYR CONTROL 10ML LL (SYRINGE) ×2 IMPLANT
TAPE CLOTH SURG 6X10 WHT LF (GAUZE/BANDAGES/DRESSINGS) ×2 IMPLANT
TOWEL OR 17X24 6PK STRL BLUE (TOWEL DISPOSABLE) ×2 IMPLANT
TOWEL OR 17X26 10 PK STRL BLUE (TOWEL DISPOSABLE) ×2 IMPLANT
TUBE CONNECTING 12X1/4 (SUCTIONS) ×2 IMPLANT
WATER STERILE IRR 1000ML POUR (IV SOLUTION) IMPLANT
YANKAUER SUCT BULB TIP NO VENT (SUCTIONS) ×2 IMPLANT

## 2012-04-12 NOTE — Progress Notes (Signed)
Pt. Has home CPAP unit and is able to placed himself on/off.

## 2012-04-12 NOTE — Anesthesia Preprocedure Evaluation (Addendum)
Anesthesia Evaluation  Patient identified by MRN, date of birth, ID band Patient awake    Reviewed: Allergy & Precautions, H&P , NPO status , Patient's Chart, lab work & pertinent test results, reviewed documented beta blocker date and time   History of Anesthesia Complications Negative for: history of anesthetic complications  Airway Mallampati: II TM Distance: >3 FB Neck ROM: Full    Dental  (+) Chipped and Dental Advisory Given,    Pulmonary asthma , sleep apnea and Continuous Positive Airway Pressure Ventilation ,  breath sounds clear to auscultation  Pulmonary exam normal       Cardiovascular negative cardio ROS  Rhythm:Regular Rate:Normal     Neuro/Psych negative neurological ROS  negative psych ROS   GI/Hepatic negative GI ROS, Neg liver ROS,   Endo/Other  Diabetes mellitus- (glu 132), Well Controlled, Type 2, Oral Hypoglycemic AgentsMorbid obesity  Renal/GU Hx of kidney cancer, partial nephrectomy  negative genitourinary   Musculoskeletal negative musculoskeletal ROS (+)   Abdominal (+) + obese,   Peds negative pediatric ROS (+)  Hematology negative hematology ROS (+)   Anesthesia Other Findings   Reproductive/Obstetrics negative OB ROS                         Anesthesia Physical Anesthesia Plan  ASA: III  Anesthesia Plan: General   Post-op Pain Management:    Induction: Intravenous  Airway Management Planned: Oral ETT  Additional Equipment:   Intra-op Plan:   Post-operative Plan: Extubation in OR  Informed Consent: I have reviewed the patients History and Physical, chart, labs and discussed the procedure including the risks, benefits and alternatives for the proposed anesthesia with the patient or authorized representative who has indicated his/her understanding and acceptance.   Dental advisory given  Plan Discussed with: Anesthesiologist, CRNA and  Surgeon  Anesthesia Plan Comments: (Plan routine monitors, GETA )       Anesthesia Quick Evaluation

## 2012-04-12 NOTE — Preoperative (Signed)
Beta Blockers   Reason not to administer Beta Blockers:Not Applicable 

## 2012-04-12 NOTE — Anesthesia Postprocedure Evaluation (Signed)
  Anesthesia Post-op Note  Patient: Derek [name]  Procedure(s) Performed: Procedure(s) (LRB): HERNIA REPAIR INCISIONAL (N/A) INSERTION OF MESH (N/A)  Patient Location: PACU  Anesthesia Type: General  Level of Consciousness: awake, alert  and oriented  Airway and Oxygen Therapy: Patient Spontanous Breathing  Post-op Pain: mild  Post-op Assessment: Post-op Vital signs reviewed, Patient's Cardiovascular Status Stable, Respiratory Function Stable, Patent Airway, No signs of Nausea or vomiting and Pain level controlled  Post-op Vital Signs: Reviewed and stable  Complications: No apparent anesthesia complications

## 2012-04-12 NOTE — Transfer of Care (Signed)
Immediate Anesthesia Transfer of Care Note  Patient: Derek [name]  Procedure(s) Performed: Procedure(s) (LRB): HERNIA REPAIR INCISIONAL (N/A) INSERTION OF MESH (N/A)  Patient Location: PACU  Anesthesia Type: General  Level of Consciousness: awake, alert  and oriented  Airway & Oxygen Therapy: Patient Spontanous Breathing and Patient connected to nasal cannula oxygen  Post-op Assessment: Report given to PACU RN and Post -op Vital signs reviewed and stable  Post vital signs: Reviewed and stable  Complications: No apparent anesthesia complications

## 2012-04-12 NOTE — H&P (View-Only) (Signed)
Patient ID: Derek [name], male   DOB: 08/15/1956, 56 y.o.   MRN: [id number]  No chief complaint on file.   HPI Derek [name] is a 56 y.o. male.   HPI The patient presents at the request of Dr. Laverle Patter for a bulge in his right abdomen. He had a laparoscopic assisted partial nephrectomy in November. His extraction site was the right midabdomen. He's develop a bulge there. He is not having a significant pain. He is able to exercise and lifts weights without difficulty. He has no nausea or vomiting.  Past Medical History  Diagnosis Date  . Asthma     asthma type symptoms   . Diabetes mellitus     on po meds well controlled per pt   . Chronic kidney disease     right renal neoplasm   . Arthritis     hx of in left hand and thumb surgery to correct   . Cancer     right renla neoplasm     Past Surgical History  Procedure Date  . Tonsillectomy     age 70   . Other surgical history     surgery to correct  sleep apnea   . Other surgical history     surgery to correct arthritis in left hand and thumb   . Other surgical history     kidney stoen surgery   . Other surgical history     laser eye surgery   . Robot assisted laparoscopic nephrectomy 10/31/2011    Procedure: ROBOTIC ASSISTED LAPAROSCOPIC NEPHRECTOMY;  Surgeon: Crecencio Mc, MD;  Location: [facility];  Service: Urology;  Laterality: Right;  Right Robotic Assisted Laparoscopic Partial Nephrectomy    No family history on file.  Social History History  Substance Use Topics  . Smoking status: Former Smoker -- 20 years    Quit date: 12/23/1993  . Smokeless tobacco: Not on file  . Alcohol Use: 1.8 oz/week    3 Cans of beer per week    Allergies  Allergen Reactions  . Biaxin (Clarithromycin Er) Rash  . Sulfa Antibiotics Rash    Current Outpatient Prescriptions  Medication Sig Dispense Refill  . budesonide (RHINOCORT AQUA) 32 MCG/ACT nasal spray Place 2 sprays into the nose 2 (two) times daily.       .  cetirizine-pseudoephedrine (ZYRTEC-D) 5-120 MG per tablet Take 1 tablet by mouth 2 (two) times daily.       . Fluticasone-Salmeterol (ADVAIR) 100-50 MCG/DOSE AEPB Inhale 1 puff into the lungs every 12 (twelve) hours.       . metFORMIN (GLUCOPHAGE) 500 MG tablet Take 500 mg by mouth 2 (two) times daily with a meal.       . Multiple Vitamins-Minerals (MULTIVITAMINS THER. W/MINERALS) TABS Take 1 tablet by mouth daily.       Marland Kitchen oxymetazoline (AFRIN) 0.05 % nasal spray Place 2 sprays into the nose 2 (two) times daily as needed. Allergies       . simvastatin (ZOCOR) 20 MG tablet Take 20 mg by mouth at bedtime.         Review of Systems Review of Systems  Constitutional: Negative for fever, chills and unexpected weight change.  HENT: Negative for hearing loss, congestion, sore throat, trouble swallowing and voice change.   Eyes: Negative for visual disturbance.  Respiratory: Negative for cough and wheezing.   Cardiovascular: Negative for chest pain, palpitations and leg swelling.  Gastrointestinal: Negative for nausea, vomiting, abdominal pain, diarrhea, constipation, blood in stool, abdominal  distention, anal bleeding and rectal pain.  Genitourinary: Negative for hematuria and difficulty urinating.  Musculoskeletal: Negative for arthralgias.  Skin: Negative for rash and wound.  Neurological: Negative for seizures, syncope, weakness and headaches.  Hematological: Negative for adenopathy. Does not bruise/bleed easily.  Psychiatric/Behavioral: Negative for confusion.    Blood pressure 140/90, [name] 80, height 5\' 8"  (1.727 m), weight 308 lb 9.6 oz (139.98 kg).  Physical Exam Physical Exam  Constitutional: He appears well-developed and well-nourished.  HENT:  Head: Normocephalic and atraumatic.  Eyes: EOM are normal. Pupils are equal, round, and reactive to light.  Neck: Normal range of motion. Neck supple.  Cardiovascular: Normal rate and regular rhythm.   Pulmonary/Chest: Effort normal and  breath sounds normal.  Abdominal:    Skin: Skin is warm, dry and intact.  Psychiatric: He has a normal mood and affect. His speech is normal and behavior is normal. Judgment and thought content normal. Cognition and memory are normal.    Data Reviewed Dr Laverle Patter' s notes  Assessment    Incisional hernia    Plan    Will need repair.  Probably open the best approach. The risk of hernia repair include bleeding,  Infection,   Recurrence of the hernia,  Mesh use, chronic pain,  Organ injury,  Bowel injury,  Bladder injury,   nerve injury with numbness around the incision,  Death,  and worsening of preexisting  medical problems.  The alternatives to surgery have been discussed as well..  Long term expectations of both operative and non operative treatments have been discussed.   The patient agrees to proceed.       Conda Wannamaker A. 03/19/2012, 9:24 AM

## 2012-04-12 NOTE — Interval H&P Note (Signed)
History and Physical Interval Note:  04/12/2012 6:43 AM  Derek [name]  has presented today for surgery, with the diagnosis of incisional hernia  The various methods of treatment have been discussed with the patient and family. After consideration of risks, benefits and other options for treatment, the patient has consented to  Procedure(s) (LRB): HERNIA REPAIR INCISIONAL (N/A) INSERTION OF MESH (N/A) as a surgical intervention .  The patients' history has been reviewed, patient examined, no change in status, stable for surgery.  I have reviewed the patients' chart and labs.  Questions were answered to the patient's satisfaction.     Rawan Riendeau A.

## 2012-04-12 NOTE — Op Note (Signed)
Preoperative diagnoses: Incisional hernia  Postoperative diagnosis: Same  Procedure: Repair of incisional hernia with mesh  Surgeon: Harriette Bouillon M.D.  Anesthesia: Gen. endotracheal anesthesia with 20 cc mixed with 20 cc Exparel  EBL: Minimal  Drains: 19 round Blake drain  Indications for procedure: Patient presents with an incisional hernia from a previous laparoscopic surgical procedure incision site. This measured 4 x 6 cm on exam. We discussed options of repair laparoscopic versus open. Risks, benefits and alternative therapies were discussed. Questions were answered. He was to proceed with open repair of his incisional hernia.The risk of hernia repair include bleeding,  Infection,   Recurrence of the hernia,  Mesh use, chronic pain,  Organ injury,  Bowel injury,  Bladder injury,   nerve injury with numbness around the incision,  Death,  and worsening of preexisting  medical problems.  The alternatives to surgery have been discussed as well..  Long term expectations of both operative and non operative treatments have been discussed.   The patient agrees to proceed.  Description of procedure: The patient was met in the holding area and questions are answered. He is taken back to the operating room and placed supine. After induction of general anesthesia the abdomen was prepped and draped in sterile fashion and timeout was done. He received 2 g Ancef. Hernia glucose his right abdomen is below his right costal margin. Transverse incision was made. Dissection was carried along the fascia and a large hernia sac was identified and opened. There is no incarcerated contents. There were a few adhesions taken down with sharp dissection and electrocautery.  The fascial edge with cokers. The preperitoneal plane was created underneath the transversus abdominis muscle. This was done circumferentially carrying it 4 cm the fascial edge. This layer was then closed with #1 PDS. A 6" x 6" piece of ultra pro  mesh was brought onto the field. This was then placed in a submuscular position bringing the sutures out through the external oblique muscle circumferentially. #1 Novafil was used to suture the mesh. Irrigation was used. The external oblique was closed over the mesh with #1 running PDS. The subcutaneous tissues were irrigated.Exparel was infiltrated into the muscle and fascia. Through separate stab incision and 19 round  drain was placed in the subcutaneous fat of the hernia sac was located. Dressing was applied and  the JP was placed to bulb suction. The patient was extubated taken recovery in satisfactory condition. All final counts sponge, needle and instrument are found to be correct.

## 2012-04-13 LAB — GLUCOSE, CAPILLARY: Glucose-Capillary: 105 mg/dL — ABNORMAL HIGH (ref 70–99)

## 2012-04-13 MED ORDER — OXYCODONE-ACETAMINOPHEN 5-325 MG PO TABS: 1.0000 | ORAL_TABLET | ORAL | Status: AC | PRN

## 2012-04-13 NOTE — Discharge Instructions (Signed)
[facility]  UMBILICAL OR INGUINAL HERNIA REPAIR: POST OP INSTRUCTIONS  Always review your discharge instruction sheet given to you by the facility where your surgery was performed. IF YOU HAVE DISABILITY OR FAMILY LEAVE FORMS, YOU MUST BRING THEM TO THE OFFICE FOR PROCESSING.   DO NOT GIVE THEM TO YOUR DOCTOR.  1. A  prescription for pain medication may be given to you upon discharge.  Take your pain medication as prescribed, if needed.  If narcotic pain medicine is not needed, then you may take acetaminophen (Tylenol) or ibuprofen (Advil) as needed. 2. Take your usually prescribed medications unless otherwise directed. 3. If you need a refill on your pain medication, please contact your pharmacy.  They will contact our office to request authorization. Prescriptions will not be filled after 5 pm or on week-ends. 4. You should follow a light diet the first 24 hours after arrival home, such as soup and crackers, etc.  Be sure to include lots of fluids daily.  Resume your normal diet the day after surgery. 5. Most patients will experience some swelling and bruising around the umbilicus or in the groin and scrotum.  Ice packs and reclining will help.  Swelling and bruising can take several days to resolve.  6. It is common to experience some constipation if taking pain medication after surgery.  Increasing fluid intake and taking a stool softener (such as Colace) will usually help or prevent this problem from occurring.  A mild laxative (Milk of Magnesia or Miralax) should be taken according to package directions if there are no bowel movements after 48 hours. 7. Unless discharge instructions indicate otherwise, you may remove your bandages 24-48 hours after surgery, and you may shower at that time.  You may have steri-strips (small skin tapes) in place directly over the incision.  These strips should be left on the skin for 7-10 days.  If your surgeon used skin glue on the  incision, you may shower in 24 hours.  The glue will flake off over the next 2-3 weeks.  Any sutures or staples will be removed at the office during your follow-up visit. 8. ACTIVITIES:  You may resume regular (light) daily activities beginning the next day--such as daily self-care, walking, climbing stairs--gradually increasing activities as tolerated.  You may have sexual intercourse when it is comfortable.  Refrain from any heavy lifting or straining until approved by your doctor. a. You may drive when you are no longer taking prescription pain medication, you can comfortably wear a seatbelt, and you can safely maneuver your car and apply brakes. b. RETURN TO WORK:  __________________________________________________________ 9. You should see your doctor in the office for a follow-up appointment approximately 2-3 weeks after your surgery.  Make sure that you call for this appointment within a day or two after you arrive home to insure a convenient appointment time. 10. OTHER INSTRUCTIONS:  __________________________________________________________________________________________________________________________________________________________________________________________  WHEN TO CALL YOUR DOCTOR: 1. Fever over 101.0 2. Inability to urinate 3. Nausea and/or vomiting 4. Extreme swelling or bruising 5. Continued bleeding from incision. 6. Increased pain, redness, or drainage from the incision  The clinic staff is available to answer your questions during regular business hours.  Please don't hesitate to call and ask to speak to one of the nurses for clinical concerns.  If you have a medical emergency, go to the nearest emergency room or call 911.  A surgeon from [facility] is always on call at the hospital     [address], [place], [place]  [address] ?  [address] [address], [place], [place]   [address] [phone] ? [phone] ? FAX [phone] Web site:  [email or web address]    Empty drain daily.

## 2012-04-13 NOTE — Progress Notes (Signed)
1 Day Post-Op  Subjective: Doing well  Objective: Vital signs in last 24 hours: Temp:  [97.6 F (36.4 C)-98.7 F (37.1 C)] 97.6 F (36.4 C) (05/03 0518) Pulse Rate:  [61-87] 75  (05/03 0518) Resp:  [13-18] 18  (05/03 0518) BP: (118-151)/(60-86) 151/78 mmHg (05/03 0518) SpO2:  [90 %-98 %] 96 % (05/03 0518) Weight:  [308 lb (139.708 kg)] 308 lb (139.708 kg) (05/02 1104) Last BM Date: 04/11/12  Intake/Output from previous day: 05/02 0701 - 05/03 0700 In: 1648 [P.O.:120; I.V.:1528] Out: 3155 [Urine:2975; Drains:130; Blood:50] Intake/Output this shift: Total I/O In: -  Out: 400 [Urine:400]  Incision/Wound:clean dry intact JP serous  Lab Results:   Basename 04/12/12 1315  WBC 19.6*  HGB 15.2  HCT 44.6  PLT 206   BMET  Basename 04/12/12 1315  NA --  K --  CL --  CO2 --  GLUCOSE --  BUN --  CREATININE 0.79  CALCIUM --   PT/INR No results found for this basename: LABPROT:2,INR:2 in the last 72 hours ABG No results found for this basename: PHART:2,PCO2:2,PO2:2,HCO3:2 in the last 72 hours  Studies/Results: No results found.  Anti-infectives: Anti-infectives     Start     Dose/Rate Route Frequency Ordered Stop   04/12/12 1400   ceFAZolin (ANCEF) IVPB 1 g/50 mL premix        1 g 100 mL/hr over 30 Minutes Intravenous 3 times per day 04/12/12 1124 04/12/12 1354   04/11/12 1407   ceFAZolin (ANCEF) IVPB 2 g/50 mL premix  Status:  Discontinued        2 g 100 mL/hr over 30 Minutes Intravenous 60 min pre-op 04/11/12 1407 04/12/12 1054          Assessment/Plan: s/p Procedure(s) (LRB): HERNIA REPAIR INCISIONAL (N/A) INSERTION OF MESH (N/A) Discharge  LOS: 1 day    Derek [name] A. 04/13/2012

## 2012-04-13 NOTE — Progress Notes (Signed)
Patient discharged to home in care of spouse. Medications and instructions reviewed with patient and spouse with no questions. Drain care reviewed with patient and spouse and return demonstration done. IV d/c'd with cath intact. Assessment unchanged from this am. Patient is to follow up with Dr. Biagio Quint in 2 weeks.

## 2012-04-13 NOTE — Discharge Summary (Signed)
Physician Discharge Summary  Patient ID: Derek [name] MRN: [id number] DOB/AGE: 1956-05-02 56 y.o.  Admit date: 04/12/2012 Discharge date: 04/13/2012  Admission Diagnoses:  Incisional hernia  Discharge Diagnoses: same Active Problems:  * No active hospital problems. *   Past Medical History  Diagnosis Date  . Asthma     asthma type symptoms   . Diabetes mellitus     on po meds well controlled per pt   . Arthritis     hx of in left hand and thumb surgery to correct   . Cancer     right renla neoplasm   . Seasonal allergies   . Sleep apnea     CPAP  Tested >10 years ago.  . Chronic kidney disease     right renal neoplasm . History of kidney stones.    Discharged Condition: good  Hospital Course: Unremarkable.  Tolerated diet with good pain control.  Consults: None  Significant Diagnostic Studies: none  Treatments: surgery: incisional hernia repair  Discharge Exam: Blood pressure 151/78, pulse 75, temperature 97.6 F (36.4 C), temperature source Oral, resp. rate 18, height 5\' 8"  (1.727 m), weight 308 lb (139.708 kg), SpO2 96.00%. Incision/Wound:clean dry intact  Disposition: 01-Home or Self Care  Discharge Orders    Future Appointments: Provider: Department: Dept Phone: Center:   04/27/2012 11:30 AM Maisie Fus A. Naquan Garman, MD [facility] [phone] None     Future Orders Please Complete By Expires   Diet - low sodium heart healthy      Increase activity slowly      Driving Restrictions      Comments:   Drive next week.   Lifting restrictions      Comments:   No lifting greater than 15 lbs for 6 weeks   Remove dressing in 24 hours      Scheduling Instructions:   English as a second language teacher. neosporin to incision daily and cover with gauze.     Medication List  As of 04/13/2012  8:31 AM   TAKE these medications         cetirizine-pseudoephedrine 5-120 MG per tablet   Commonly known as: ZYRTEC-D   Take 1 tablet by mouth 2 (two) times daily.     Fluticasone-Salmeterol 100-50 MCG/DOSE Aepb   Commonly known as: ADVAIR   Inhale 1 puff into the lungs every 12 (twelve) hours.      metFORMIN 500 MG tablet   Commonly known as: GLUCOPHAGE   Take 500 mg by mouth 2 (two) times daily with a meal.      multivitamins ther. w/minerals Tabs   Take 1 tablet by mouth daily.      oxyCODONE-acetaminophen 5-325 MG per tablet   Commonly known as: PERCOCET   Take 1-2 tablets by mouth every 4 (four) hours as needed for pain.      oxymetazoline 0.05 % nasal spray   Commonly known as: AFRIN   Place 2 sprays into the nose daily as needed. Allergies        RHINOCORT AQUA 32 MCG/ACT nasal spray   Generic drug: budesonide   Place 2 sprays into the nose daily.      simvastatin 20 MG tablet   Commonly known as: ZOCOR   Take 20 mg by mouth at bedtime.           Follow-up Information    Follow up with Lavonna Lampron A., MD in 1 week.   Contact information:   [facility] [address], [address]  [place] [address] [phone]          Signed: Aldwin Micalizzi A. 04/13/2012, 8:31 AM

## 2012-04-16 ENCOUNTER — Encounter (HOSPITAL_COMMUNITY): Payer: Self-pay | Admitting: Surgery

## 2012-04-20 ENCOUNTER — Ambulatory Visit (INDEPENDENT_AMBULATORY_CARE_PROVIDER_SITE_OTHER): Payer: BC Managed Care – PPO | Admitting: Surgery

## 2012-04-20 ENCOUNTER — Encounter (INDEPENDENT_AMBULATORY_CARE_PROVIDER_SITE_OTHER): Payer: Self-pay | Admitting: Surgery

## 2012-04-20 VITALS — BP 160/96 | HR 92 | Temp 97.2°F | Resp 24 | Ht 68.0 in | Wt 306.2 lb

## 2012-04-20 DIAGNOSIS — Z9889 Other specified postprocedural states: Secondary | ICD-10-CM

## 2012-04-20 NOTE — Patient Instructions (Signed)
Return 1 month.  Resume full activity in 3 weeks.  Steri strips will fall off in 1 -2 weeks.  OK to shower.  Remove tape Saturday and cover drain site with band aid.

## 2012-04-20 NOTE — Progress Notes (Signed)
Pt returns today after hernia repair.  Pain is well controlled.  Bowels are functioning.  Wound is clean.  On exam:  Incision is clean /dry/intact.  Area is soft without signs of hernia recurrence. JP removed and staples removed.    Impression:  Status repair of hernia port site   Plan:  RTC 1 month Return full activities in 3 weeks.

## 2012-04-27 ENCOUNTER — Encounter (INDEPENDENT_AMBULATORY_CARE_PROVIDER_SITE_OTHER): Payer: BC Managed Care – PPO | Admitting: Surgery

## 2012-05-21 ENCOUNTER — Ambulatory Visit (INDEPENDENT_AMBULATORY_CARE_PROVIDER_SITE_OTHER): Payer: BC Managed Care – PPO | Admitting: Surgery

## 2012-05-21 ENCOUNTER — Encounter (INDEPENDENT_AMBULATORY_CARE_PROVIDER_SITE_OTHER): Payer: Self-pay | Admitting: Surgery

## 2012-05-21 VITALS — BP 146/88 | HR 76 | Temp 98.6°F | Resp 18 | Ht 68.0 in | Wt 315.2 lb

## 2012-05-21 DIAGNOSIS — Z9889 Other specified postprocedural states: Secondary | ICD-10-CM

## 2012-05-21 NOTE — Progress Notes (Signed)
Pt returns today after incisional hernia repair  on 04/12/2012. hernia repair.  Pain is well controlled.  Bowels are functioning.  Wound is clean.   On exam:  Incision is clean /dry/intact.  Area is soft without signs of hernia recurrence.  Impression:  Status repair of hernia  Plan:  RTC PRN  Return to full activity 1 week.

## 2012-05-21 NOTE — Patient Instructions (Signed)
Return full activity 1 week.

## 2012-11-06 ENCOUNTER — Telehealth (INDEPENDENT_AMBULATORY_CARE_PROVIDER_SITE_OTHER): Payer: Self-pay

## 2012-11-06 NOTE — Telephone Encounter (Signed)
Pt called stating he has noticed soreness in right groin area close to hernia site. No bulge. No swelling. No redness. No difficulty with urination.  Pt states he works out regularly. Pt states he can take advil. Pt advised to use advil 200mg  tabs x2 3 times a day and ice pack to groin area after he works out. Pt to call if this does not improve in the next few day  with this treatment. Pt to call if bulge,swelling or redness occur.

## 2013-01-03 ENCOUNTER — Other Ambulatory Visit: Payer: Self-pay | Admitting: Urology

## 2013-01-03 ENCOUNTER — Ambulatory Visit (HOSPITAL_COMMUNITY)
Admission: RE | Admit: 2013-01-03 | Discharge: 2013-01-03 | Disposition: A | Discharge status: Home / Self Care | Payer: BC Managed Care – PPO | Source: Ambulatory Visit | Attending: Urology | Admitting: Urology

## 2013-01-03 DIAGNOSIS — I517 Cardiomegaly: Secondary | ICD-10-CM | POA: Insufficient documentation

## 2013-01-03 DIAGNOSIS — C649 Malignant neoplasm of unspecified kidney, except renal pelvis: Secondary | ICD-10-CM | POA: Insufficient documentation

## 2013-01-03 DIAGNOSIS — I2789 Other specified pulmonary heart diseases: Secondary | ICD-10-CM | POA: Insufficient documentation

## 2013-01-11 ENCOUNTER — Other Ambulatory Visit: Payer: Self-pay | Admitting: Gastroenterology

## 2014-01-10 ENCOUNTER — Ambulatory Visit (INDEPENDENT_AMBULATORY_CARE_PROVIDER_SITE_OTHER): Payer: BC Managed Care – PPO | Admitting: Emergency Medicine

## 2014-01-10 ENCOUNTER — Ambulatory Visit: Payer: BC Managed Care – PPO

## 2014-01-10 VITALS — BP 130/70 | HR 91 | Temp 98.3°F | Resp 22 | Ht 67.5 in | Wt 328.0 lb

## 2014-01-10 DIAGNOSIS — J209 Acute bronchitis, unspecified: Secondary | ICD-10-CM

## 2014-01-10 DIAGNOSIS — R0602 Shortness of breath: Secondary | ICD-10-CM

## 2014-01-10 DIAGNOSIS — E119 Type 2 diabetes mellitus without complications: Secondary | ICD-10-CM

## 2014-01-10 DIAGNOSIS — J45901 Unspecified asthma with (acute) exacerbation: Secondary | ICD-10-CM

## 2014-01-10 DIAGNOSIS — G4733 Obstructive sleep apnea (adult) (pediatric): Secondary | ICD-10-CM

## 2014-01-10 LAB — POCT CBC
GRANULOCYTE PERCENT: 83.5 % — AB (ref 37–80)
HEMATOCRIT: 51.3 % (ref 43.5–53.7)
Hemoglobin: 16 g/dL (ref 14.1–18.1)
LYMPH, POC: 1.1 (ref 0.6–3.4)
MCH, POC: 29.7 pg (ref 27–31.2)
MCHC: 31.2 g/dL — AB (ref 31.8–35.4)
MCV: 95.2 fL (ref 80–97)
MID (cbc): 1.1 — AB (ref 0–0.9)
MPV: 8.4 fL (ref 0–99.8)
POC Granulocyte: 11.2 — AB (ref 2–6.9)
POC LYMPH %: 8.1 % — AB (ref 10–50)
POC MID %: 8.4 %M (ref 0–12)
Platelet Count, POC: 205 10*3/uL (ref 142–424)
RBC: 5.39 M/uL (ref 4.69–6.13)
RDW, POC: 14.8 %
WBC: 13.4 10*3/uL — AB (ref 4.6–10.2)

## 2014-01-10 LAB — GLUCOSE, POCT (MANUAL RESULT ENTRY): POC Glucose: 263 mg/dL — AB (ref 70–99)

## 2014-01-10 MED ORDER — ALBUTEROL SULFATE HFA 108 (90 BASE) MCG/ACT IN AERS: 2.0000 | INHALATION_SPRAY | RESPIRATORY_TRACT | Status: AC | PRN

## 2014-01-10 MED ORDER — ALBUTEROL SULFATE (2.5 MG/3ML) 0.083% IN NEBU
2.5000 mg | INHALATION_SOLUTION | Freq: Once | RESPIRATORY_TRACT | Status: AC
  Administered 2014-01-10: 2.5 mg via RESPIRATORY_TRACT

## 2014-01-10 MED ORDER — IBUPROFEN 200 MG PO TABS: 800.0000 mg | ORAL_TABLET | Freq: Once | ORAL | Status: DC

## 2014-01-10 MED ORDER — ALBUTEROL SULFATE (2.5 MG/3ML) 0.083% IN NEBU: 5.0000 mg | INHALATION_SOLUTION | Freq: Once | RESPIRATORY_TRACT | Status: DC

## 2014-01-10 MED ORDER — LEVOFLOXACIN 500 MG PO TABS: 500.0000 mg | ORAL_TABLET | Freq: Every day | ORAL | Status: AC

## 2014-01-10 MED ORDER — ALBUTEROL SULFATE (2.5 MG/3ML) 0.083% IN NEBU: 2.5000 mg | INHALATION_SOLUTION | Freq: Once | RESPIRATORY_TRACT | Status: DC

## 2014-01-10 MED ORDER — IPRATROPIUM BROMIDE 0.02 % IN SOLN: 0.5000 mg | Freq: Once | RESPIRATORY_TRACT | Status: DC

## 2014-01-10 NOTE — Progress Notes (Signed)
[facility] [address] [place] [address] [phone]  Date:  01/10/2014   Name:  Derek [name]   DOB:  [birth year]   MRN:  [id number]  PCP:  Horton Finer, MD    Chief Complaint: Fever, Chills, Shortness of Breath and Chest Congestion   History of Present Illness:  Derek [name] is a 58 y.o. very pleasant male patient who presents with the following:  Ill since yesterday.  Developed a fever, cough and body aches in the morning that progressively worsened.  Temp to 103 with tylenol.  No coryza.  Cough is productive of purulent sputum.  Increased wheezing and shortness of breath.  Worsened overnight.  Has an Advair inhaler and used that but has no albuterol.  No nausea or vomiting.  Has a severe headache with a history of migraines. Sore throat prevents eating.   No new neuro or visual symptoms.  No stool change or rash.  No improvement with over the counter medications or other home remedies. Denies other complaint or health concern today.   There are no active problems to display for this patient.   Past Medical History  Diagnosis Date  . Asthma     asthma type symptoms   . Diabetes mellitus     on po meds well controlled per pt   . Arthritis     hx of in left hand and thumb surgery to correct   . Cancer     right renla neoplasm   . Seasonal allergies   . Sleep apnea     CPAP  Tested >10 years ago.  . Chronic kidney disease     right renal neoplasm . History of kidney stones.    Past Surgical History  Procedure Laterality Date  . Tonsillectomy      age 40   . Other surgical history      surgery to correct  sleep apnea   . Other surgical history      surgery to correct arthritis in left hand and thumb   . Other surgical history      kidney stoen surgery   . Other surgical history      laser eye surgery   . Robot assisted laparoscopic nephrectomy  10/31/2011    Procedure: ROBOTIC ASSISTED LAPAROSCOPIC NEPHRECTOMY;  Surgeon:  Dutch Gray, MD;  Location: [facility];  Service: Urology;  Laterality: Right;  Right Robotic Assisted Laparoscopic Partial Nephrectomy  . Lithrotrispy    . Lasik      Bilateral  . Hernia repair  04/12/2012  . Incisional hernia repair  04/12/2012    Procedure: HERNIA REPAIR INCISIONAL;  Surgeon: Joyice Faster. Cornett, MD;  Location: [facility];  Service: General;  Laterality: N/A;    History  Substance Use Topics  . Smoking status: Former Smoker -- 20 years    Quit date: 12/23/1993  . Smokeless tobacco: Never Used  . Alcohol Use: 1.8 oz/week    3 Cans of beer per week     Comment: occassional    Family History  Problem Relation Age of Onset  . Anesthesia problems Neg Hx   . Heart disease Mother   . Cancer Father     lung    Allergies  Allergen Reactions  . Biaxin [Clarithromycin] Rash  . Sulfa Antibiotics Rash    Medication list has been reviewed and updated.  Current Outpatient Prescriptions on File Prior to Visit  Medication Sig Dispense Refill  . budesonide (RHINOCORT  AQUA) 32 MCG/ACT nasal spray Place 2 sprays into the nose daily.       . cetirizine-pseudoephedrine (ZYRTEC-D) 5-120 MG per tablet Take 1 tablet by mouth 2 (two) times daily.       . Fluticasone-Salmeterol (ADVAIR) 100-50 MCG/DOSE AEPB Inhale 1 puff into the lungs every 12 (twelve) hours.       . metFORMIN (GLUCOPHAGE) 500 MG tablet Take 500 mg by mouth 2 (two) times daily with a meal.       . Multiple Vitamins-Minerals (MULTIVITAMINS THER. W/MINERALS) TABS Take 1 tablet by mouth daily.       Marland Kitchen oxymetazoline (AFRIN) 0.05 % nasal spray Place 2 sprays into the nose daily as needed. Allergies       . simvastatin (ZOCOR) 20 MG tablet Take 20 mg by mouth at bedtime.        No current facility-administered medications on file prior to visit.    Review of Systems:  As per HPI, otherwise negative.    Physical Examination: Filed Vitals:   01/10/14 1433  BP: 130/70  Pulse: 91  Temp: 98.3 F (36.8 C)  Resp: 22    Filed Vitals:   01/10/14 1433  Height: 5' 7.5" (1.715 m)  Weight: 328 lb (148.78 kg)   Body mass index is 50.58 kg/(m^2). Ideal Body Weight: Weight in (lb) to have BMI = 25: 161.7  GEN: morbidly obese, marked distress, Non-toxic, A & O x 3 HEENT: Atraumatic, Normocephalic. Neck supple. No masses, No LAD. Ears and Nose: No external deformity. CV: RRR, No M/G/R. No JVD. No thrill. No extra heart sounds. PULM: CTA B, no wheezes, crackles, rhonchi. No retractions. No resp. distress. No accessory muscle use. ABD: S, NT, ND, +BS. No rebound. No HSM. EXTR: No c/c/e NEURO Normal gait.  PSYCH: Normally interactive. Conversant. Not depressed or anxious appearing.  Calm demeanor.    Assessment and Plan: Exacerbation asthma Acute bronchitis NIDDM Atelectasis levaquin Albuterol  Signed,  Ellison Carwin, MD    Results for orders placed in visit on 01/10/14  GLUCOSE, POCT (MANUAL RESULT ENTRY)      Result Value Range   POC Glucose 263 (*) 70 - 99 mg/dl  POCT CBC      Result Value Range   WBC 13.4 (*) 4.6 - 10.2 K/uL   Lymph, poc 1.1  0.6 - 3.4   POC LYMPH PERCENT 8.1 (*) 10 - 50 %L   MID (cbc) 1.1 (*) 0 - 0.9   POC MID % 8.4  0 - 12 %M   POC Granulocyte 11.2 (*) 2 - 6.9   Granulocyte percent 83.5 (*) 37 - 80 %G   RBC 5.39  4.69 - 6.13 M/uL   Hemoglobin 16.0  14.1 - 18.1 g/dL   HCT, POC 51.3  43.5 - 53.7 %   MCV 95.2  80 - 97 fL   MCH, POC 29.7  27 - 31.2 pg   MCHC 31.2 (*) 31.8 - 35.4 g/dL   RDW, POC 14.8     Platelet Count, POC 205  142 - 424 K/uL   MPV 8.4  0 - 99.8 fL   [facility] reading (PRIMARY) by  Dr. Ouida Sills.  No acute change other than left basilar atelectasis.

## 2014-01-10 NOTE — Patient Instructions (Signed)
Sinusitis  Sinusitis is redness, soreness, and swelling (inflammation) of the paranasal sinuses. Paranasal sinuses are air pockets within the bones of your face (beneath the eyes, the middle of the forehead, or above the eyes). In healthy paranasal sinuses, mucus is able to drain out, and air is able to circulate through them by way of your nose. However, when your paranasal sinuses are inflamed, mucus and air can become trapped. This can allow bacteria and other germs to grow and cause infection.  Sinusitis can develop quickly and last only a short time (acute) or continue over a long period (chronic). Sinusitis that lasts for more than 12 weeks is considered chronic.   CAUSES   Causes of sinusitis include:   Allergies.   Structural abnormalities, such as displacement of the cartilage that separates your nostrils (deviated septum), which can decrease the air flow through your nose and sinuses and affect sinus drainage.   Functional abnormalities, such as when the small hairs (cilia) that line your sinuses and help remove mucus do not work properly or are not present.  SYMPTOMS   Symptoms of acute and chronic sinusitis are the same. The primary symptoms are pain and pressure around the affected sinuses. Other symptoms include:   Upper toothache.   Earache.   Headache.   Bad breath.   Decreased sense of smell and taste.   A cough, which worsens when you are lying flat.   Fatigue.   Fever.   Thick drainage from your nose, which often is green and may contain pus (purulent).   Swelling and warmth over the affected sinuses.  DIAGNOSIS   Your caregiver will perform a physical exam. During the exam, your caregiver may:   Look in your nose for signs of abnormal growths in your nostrils (nasal polyps).   Tap over the affected sinus to check for signs of infection.   View the inside of your sinuses (endoscopy) with a special imaging device with a light attached (endoscope), which is inserted into your  sinuses.  If your caregiver suspects that you have chronic sinusitis, one or more of the following tests may be recommended:   Allergy tests.   Nasal culture A sample of mucus is taken from your nose and sent to a lab and screened for bacteria.   Nasal cytology A sample of mucus is taken from your nose and examined by your caregiver to determine if your sinusitis is related to an allergy.  TREATMENT   Most cases of acute sinusitis are related to a viral infection and will resolve on their own within 10 days. Sometimes medicines are prescribed to help relieve symptoms (pain medicine, decongestants, nasal steroid sprays, or saline sprays).   However, for sinusitis related to a bacterial infection, your caregiver will prescribe antibiotic medicines. These are medicines that will help kill the bacteria causing the infection.   Rarely, sinusitis is caused by a fungal infection. In theses cases, your caregiver will prescribe antifungal medicine.  For some cases of chronic sinusitis, surgery is needed. Generally, these are cases in which sinusitis recurs more than 3 times per year, despite other treatments.  HOME CARE INSTRUCTIONS    Drink plenty of water. Water helps thin the mucus so your sinuses can drain more easily.   Use a humidifier.   Inhale steam 3 to 4 times a day (for example, sit in the bathroom with the shower running).   Apply a warm, moist washcloth to your face 3 to 4 times a day,   or as directed by your caregiver.   Use saline nasal sprays to help moisten and clean your sinuses.   Take over-the-counter or prescription medicines for pain, discomfort, or fever only as directed by your caregiver.  SEEK IMMEDIATE MEDICAL CARE IF:   You have increasing pain or severe headaches.   You have nausea, vomiting, or drowsiness.   You have swelling around your face.   You have vision problems.   You have a stiff neck.   You have difficulty breathing.  MAKE SURE YOU:    Understand these  instructions.   Will watch your condition.   Will get help right away if you are not doing well or get worse.  Document Released: 11/28/2005 Document Revised: 02/20/2012 Document Reviewed: 12/13/2011  ExitCare Patient Information 2014 [employer]          Bronchitis  Bronchitis is inflammation of the airways that extend from the windpipe into the lungs (bronchi). The inflammation often causes mucus to develop, which leads to a cough. If the inflammation becomes severe, it may cause shortness of breath.  CAUSES   Bronchitis may be caused by:    Viral infections.    Bacteria.    Cigarette smoke.    Allergens, pollutants, and other irritants.   SIGNS AND SYMPTOMS   The most common symptom of bronchitis is a frequent cough that produces mucus. Other symptoms include:   Fever.    Body aches.    Chest congestion.    Chills.    Shortness of breath.    Sore throat.   DIAGNOSIS   Bronchitis is usually diagnosed through a medical history and physical exam. Tests, such as chest X-rays, are sometimes done to rule out other conditions.   TREATMENT   You may need to avoid contact with whatever caused the problem (smoking, for example). Medicines are sometimes needed. These may include:   Antibiotics. These may be prescribed if the condition is caused by bacteria.   Cough suppressants. These may be prescribed for relief of cough symptoms.    Inhaled medicines. These may be prescribed to help open your airways and make it easier for you to breathe.    Steroid medicines. These may be prescribed for those with recurrent (chronic) bronchitis.  HOME CARE INSTRUCTIONS   Get plenty of rest.    Drink enough fluids to keep your urine clear or pale yellow (unless you have a medical condition that requires fluid restriction). Increasing fluids may help thin your secretions and will prevent dehydration.    Only take over-the-counter or prescription medicines as directed by your health care  provider.   Only take antibiotics as directed. Make sure you finish them even if you start to feel better.   Avoid secondhand smoke, irritating chemicals, and strong fumes. These will make bronchitis worse. If you are a smoker, quit smoking. Consider using nicotine gum or skin patches to help control withdrawal symptoms. Quitting smoking will help your lungs heal faster.    Put a cool-mist humidifier in your bedroom at night to moisten the air. This may help loosen mucus. Change the water in the humidifier daily. You can also run the hot water in your shower and sit in the bathroom with the door closed for 5 10 minutes.    Follow up with your health care provider as directed.    Wash your hands frequently to avoid catching bronchitis again or spreading an infection to others.   SEEK MEDICAL CARE IF:  Your symptoms   do not improve after 1 week of treatment.   SEEK IMMEDIATE MEDICAL CARE IF:   Your fever increases.   You have chills.    You have chest pain.    You have worsening shortness of breath.    You have bloody sputum.   You faint.   You have lightheadedness.   You have a severe headache.    You vomit repeatedly.  MAKE SURE YOU:    Understand these instructions.   Will watch your condition.   Will get help right away if you are not doing well or get worse.  Document Released: 11/28/2005 Document Revised: 09/18/2013 Document Reviewed: 07/23/2013  Exit[employer] Patient Information 2014 [employer].

## 2014-01-11 ENCOUNTER — Telehealth: Payer: Self-pay

## 2014-01-11 NOTE — Telephone Encounter (Signed)
Patients care taker Linzie Collin is asking if Dr. Ouida Sills or another provider can prescribe the patient something for his migraine and upper respiratory infection, patient still has migraine with no relief with tylenol extra strength. I informed patient that it is not a guarantee that another Dr. Aletha Halim prescribe something because Dr. Ouida Sills is not here this weekend. She understood and doesn't want to take patient back to the clinic if she doesn't have too, if she can get some medicine. Please advise. She was told to bring patient in to be evaluated if he is worse.   pharmnacy: [id number] - [place], [place] - [address]  Best: Linzie Collin: [phone]

## 2014-01-12 ENCOUNTER — Telehealth: Payer: Self-pay

## 2014-01-12 NOTE — Telephone Encounter (Signed)
LMOM to RTC. 

## 2014-01-12 NOTE — Telephone Encounter (Signed)
PT returned our call when he was in the shower today.  Please call again

## 2014-01-12 NOTE — Telephone Encounter (Signed)
I advise the patient to RTC. Patient is already on Levaquin with supportive care.

## 2014-01-13 NOTE — Telephone Encounter (Signed)
Pt was returning our call.  He missed it when he was in the shower.

## 2014-10-28 ENCOUNTER — Ambulatory Visit: Payer: BC Managed Care – PPO | Attending: Orthopaedic Surgery | Admitting: Rehabilitation

## 2014-10-28 DIAGNOSIS — M7591 Shoulder lesion, unspecified, right shoulder: Secondary | ICD-10-CM | POA: Diagnosis not present

## 2014-10-28 DIAGNOSIS — M25519 Pain in unspecified shoulder: Secondary | ICD-10-CM | POA: Insufficient documentation

## 2014-10-28 DIAGNOSIS — E119 Type 2 diabetes mellitus without complications: Secondary | ICD-10-CM | POA: Diagnosis not present

## 2014-10-29 ENCOUNTER — Ambulatory Visit: Payer: BC Managed Care – PPO | Admitting: Rehabilitation

## 2014-10-29 DIAGNOSIS — M7591 Shoulder lesion, unspecified, right shoulder: Secondary | ICD-10-CM | POA: Diagnosis not present

## 2014-11-17 ENCOUNTER — Ambulatory Visit: Payer: BC Managed Care – PPO | Admitting: Rehabilitation

## 2014-11-19 ENCOUNTER — Encounter: Payer: BC Managed Care – PPO | Admitting: Rehabilitation

## 2014-11-24 ENCOUNTER — Encounter: Payer: BC Managed Care – PPO | Admitting: Rehabilitation

## 2014-11-26 ENCOUNTER — Encounter: Payer: BC Managed Care – PPO | Admitting: Rehabilitation

## 2014-12-10 ENCOUNTER — Ambulatory Visit (INDEPENDENT_AMBULATORY_CARE_PROVIDER_SITE_OTHER): Payer: BC Managed Care – PPO | Admitting: Physician Assistant

## 2014-12-10 VITALS — BP 136/86 | HR 81 | Temp 98.2°F | Resp 20 | Ht 68.0 in | Wt 326.4 lb

## 2014-12-10 DIAGNOSIS — H00016 Hordeolum externum left eye, unspecified eyelid: Secondary | ICD-10-CM

## 2014-12-10 NOTE — Patient Instructions (Signed)
You most likely have a hordeolum.  Treatment for this is warm compresses as often as you can, ibuprofen every 4-6 hours for pain, and eye drops over the counter to help with moisture.  Antibiotics are not 1st line for this. However, you're in pain and the area looks red. It should be improving/resolved in approx 24-48 hours. If you're not feeling better by then please give [place] a call and we'll prescribe you an oral antibiotic to pick up at the pharmacy.

## 2014-12-10 NOTE — Progress Notes (Signed)
Subjective:    Patient ID: Derek [name], male    DOB: May 10, 1956, 58 y.o. (correct age)   MRN: [id number]  PCP: Horton Finer, MD  Chief Complaint  Patient presents with  . Belepharitis    left eye since yesterday    There are no active problems to display for this patient.  Prior to Admission medications   Medication Sig Start Date End Date Taking? Authorizing Provider  albuterol (PROVENTIL HFA;VENTOLIN HFA) 108 (90 BASE) MCG/ACT inhaler Inhale 2 puffs into the lungs every 4 (four) hours as needed for wheezing or shortness of breath (cough, shortness of breath or wheezing.). 01/10/14  Yes Roselee Culver, MD  budesonide (RHINOCORT AQUA) 32 MCG/ACT nasal spray Place 2 sprays into the nose daily.    Yes Historical Provider, MD  cetirizine-pseudoephedrine (ZYRTEC-D) 5-120 MG per tablet Take 1 tablet by mouth 2 (two) times daily.    Yes Historical Provider, MD  Fluticasone-Salmeterol (ADVAIR) 100-50 MCG/DOSE AEPB Inhale 1 puff into the lungs every 12 (twelve) hours.    Yes Historical Provider, MD  metFORMIN (GLUCOPHAGE) 500 MG tablet Take 500 mg by mouth 2 (two) times daily with a meal.    Yes Historical Provider, MD  Multiple Vitamins-Minerals (MULTIVITAMINS THER. W/MINERALS) TABS Take 1 tablet by mouth daily.    Yes Historical Provider, MD  oxymetazoline (AFRIN) 0.05 % nasal spray Place 2 sprays into the nose daily as needed. Allergies    Yes Historical Provider, MD  simvastatin (ZOCOR) 20 MG tablet Take 20 mg by mouth at bedtime.    Yes Historical Provider, MD   Medications, allergies, past medical history, surgical history, family history, social history and problem list reviewed and updated.  HPI  58 yom presents with left eyelid pain that started yest morning.   Noticed red bump lower left eyelid. Painful around area. Very mild clear drainage from left eye. No purulence. No crusting on lashes. No vision changes. No pain with eye movements. Has been applying warm compresses with  minimal relief.   Review of Systems No fever, chills. No uri sx.     Objective:   Physical Exam  Constitutional: He is oriented to person, place, and time. He appears well-developed and well-nourished.  Non-toxic appearance. He does not have a sickly appearance. He does not appear ill. No distress.  BP 136/86 mmHg  Pulse 81  Temp(Src) 98.2 F (36.8 C) (Oral)  Resp 20  Ht 5\' 8"  (1.727 m)  Wt 326 lb 6.4 oz (148.054 kg)  BMI 49.64 kg/m2  SpO2 95%   Eyes: Conjunctivae and EOM are normal. Pupils are equal, round, and reactive to light. Right eye exhibits no discharge, no exudate and no hordeolum. Left eye exhibits hordeolum. Left eye exhibits no discharge and no exudate.    59mm internal hordeolum left lower eyelid margin. Slight erythema surrounding lesion.   Neurological: He is alert and oriented to person, place, and time.      Assessment & Plan:   87 yom presents with left eyelid pain that started yest morning.   Hordeolum externum, left --eyelid lesion most likely hordeolum as is painful. No crusting of lesion. No injected conjunctiva. No purulence.  --low concern for orbital cellulitis as no pain with eom --warm compresses/ibuprofen prn/lubricating eyedrops --if sx not resolved 24-48 hrs pt instructed to contact office. Would fill abx over phone at that point as pt in lot of pain and erythema surrounding lesion. Most likely bactrim  Julieta Gutting, PA-C Physician Assistant-Certified [facility]  12/10/2014 3:48 PM

## 2015-05-16 ENCOUNTER — Ambulatory Visit (INDEPENDENT_AMBULATORY_CARE_PROVIDER_SITE_OTHER): Payer: BLUE CROSS/BLUE SHIELD | Admitting: Emergency Medicine

## 2015-05-16 VITALS — BP 144/80 | HR 82 | Temp 99.1°F | Resp 20 | Ht 68.0 in | Wt 322.5 lb

## 2015-05-16 DIAGNOSIS — L237 Allergic contact dermatitis due to plants, except food: Secondary | ICD-10-CM

## 2015-05-16 MED ORDER — ALUM SULFATE-CA ACETATE EX PACK: 1.0000 | PACK | Freq: Three times a day (TID) | CUTANEOUS | Status: AC

## 2015-05-16 MED ORDER — TRIAMCINOLONE ACETONIDE 0.1 % EX CREA: 1.0000 "application " | TOPICAL_CREAM | Freq: Two times a day (BID) | CUTANEOUS | Status: AC

## 2015-05-16 NOTE — Progress Notes (Signed)
Subjective:  Patient ID: Derek [name], male    DOB: Nov 01, 1956  Age: 59 y.o. MRN: [id number]  CC: Rash   HPI Derek [name] presents  A rash on his arms. And he also has some small area on his right cheek. He was out fishing a week ago and developed poison ivy following. He denies any other complaints has no improvement with over-the-counter medication. He has no cough nausea vomiting or shortness of breath.  Outpatient Prescriptions Prior to Visit  Medication Sig Dispense Refill  . albuterol (PROVENTIL HFA;VENTOLIN HFA) 108 (90 BASE) MCG/ACT inhaler Inhale 2 puffs into the lungs every 4 (four) hours as needed for wheezing or shortness of breath (cough, shortness of breath or wheezing.). 1 Inhaler 1  . budesonide (RHINOCORT AQUA) 32 MCG/ACT nasal spray Place 2 sprays into the nose daily.     . cetirizine-pseudoephedrine (ZYRTEC-D) 5-120 MG per tablet Take 1 tablet by mouth 2 (two) times daily.     . Fluticasone-Salmeterol (ADVAIR) 100-50 MCG/DOSE AEPB Inhale 1 puff into the lungs every 12 (twelve) hours.     . metFORMIN (GLUCOPHAGE) 500 MG tablet Take 500 mg by mouth 2 (two) times daily with a meal.     . Multiple Vitamins-Minerals (MULTIVITAMINS THER. W/MINERALS) TABS Take 1 tablet by mouth daily.     Marland Kitchen oxymetazoline (AFRIN) 0.05 % nasal spray Place 2 sprays into the nose daily as needed. Allergies     . simvastatin (ZOCOR) 20 MG tablet Take 20 mg by mouth at bedtime.      No facility-administered medications prior to visit.    History   Social History  . Marital Status: Legally Separated    Spouse Name: N/A  . Number of Children: N/A  . Years of Education: N/A   Social History Main Topics  . Smoking status: Former Smoker -- 20 years    Quit date: 12/23/1993  . Smokeless tobacco: Never Used  . Alcohol Use: 1.8 oz/week    3 Cans of beer per week     Comment: occassional  . Drug Use: No  . Sexual Activity: No   Other Topics Concern  . None   Social History  Narrative    Family History  Problem Relation Age of Onset  . Anesthesia problems Neg Hx   . Heart disease Mother   . Cancer Father     lung    Past Medical History  Diagnosis Date  . Asthma     asthma type symptoms   . Diabetes mellitus     on po meds well controlled per pt   . Arthritis     hx of in left hand and thumb surgery to correct   . Cancer     right renla neoplasm   . Seasonal allergies   . Sleep apnea     CPAP  Tested >10 years ago.  . Chronic kidney disease     right renal neoplasm . History of kidney stones.     Review of Systems  Constitutional: Negative for fever, chills and appetite change.  HENT: Negative for congestion, ear pain, postnasal drip, sinus pressure and sore throat.   Eyes: Negative for pain and redness.  Respiratory: Negative for cough, shortness of breath and wheezing.   Cardiovascular: Negative for leg swelling.  Gastrointestinal: Negative for nausea, vomiting, abdominal pain, diarrhea, constipation and blood in stool.  Endocrine: Negative for polyuria.  Genitourinary: Negative for dysuria, urgency, frequency and flank pain.  Musculoskeletal: Negative for gait  problem.  Skin: Positive for rash.  Neurological: Negative for weakness and headaches.  Psychiatric/Behavioral: Negative for confusion and decreased concentration. The patient is not nervous/anxious.     Objective:  BP 144/80 mmHg  Pulse 82  Temp(Src) 99.1 F (37.3 C) (Oral)  Resp 20  Ht 5\' 8"  (1.727 m)  Wt 322 lb 8 oz (146.285 kg)  BMI 49.05 kg/m2  SpO2 96%  BP Readings from Last 3 Encounters:  05/16/15 144/80  12/10/14 136/86  01/10/14 130/70    Wt Readings from Last 3 Encounters:  05/16/15 322 lb 8 oz (146.285 kg)  12/10/14 326 lb 6.4 oz (148.054 kg)  01/10/14 328 lb (148.78 kg)    Physical Exam  Constitutional: He is oriented to person, place, and time. He appears well-developed and well-nourished.  HENT:  Head: Normocephalic and atraumatic.  Eyes:  Conjunctivae are normal. Pupils are equal, round, and reactive to light.  Pulmonary/Chest: Effort normal.  Musculoskeletal: He exhibits no edema.  Neurological: He is alert and oriented to person, place, and time.  Skin: Skin is dry. Rash noted.  Psychiatric: He has a normal mood and affect. His behavior is normal. Thought content normal.    Lab Results  Component Value Date   WBC 13.4* 01/10/2014   HGB 16.0 01/10/2014   HCT 51.3 01/10/2014   PLT 206 04/12/2012   GLUCOSE 154* 04/05/2012   ALT 23 04/05/2012   AST 22 04/05/2012   NA 139 04/05/2012   K 4.2 04/05/2012   CL 99 04/05/2012   CREATININE 0.79 04/12/2012   BUN 17 04/05/2012   CO2 29 04/05/2012   HGBA1C 7.0* 04/12/2012      .  Assessment & Plan:   [name] was seen today for rash.  Diagnoses and all orders for this visit:  Poison ivy  Morbid obesity  Other orders -     triamcinolone cream (KENALOG) 0.1 %; Apply 1 application topically 2 (two) times daily. -     aluminum sulfate-calcium acetate (DOMEBORO) packet; Apply 1 packet topically 3 (three) times daily.   I am having [name] start on triamcinolone cream and aluminum sulfate-calcium acetate. I am also having him maintain his cetirizine-pseudoephedrine, budesonide, Fluticasone-Salmeterol, multivitamins ther. w/minerals, oxymetazoline, simvastatin, metFORMIN, and albuterol.  Meds ordered this encounter  Medications  . triamcinolone cream (KENALOG) 0.1 %    Sig: Apply 1 application topically 2 (two) times daily.    Dispense:  30 g    Refill:  0  . aluminum sulfate-calcium acetate (DOMEBORO) packet    Sig: Apply 1 packet topically 3 (three) times daily.    Dispense:  100 each    Refill:  12    Appropriate red flag conditions were discussed with the patient as well as actions that should be taken.  Patient expressed his understanding.  Follow-up: Return if symptoms worsen or fail to improve.  Roselee Culver, MD

## 2015-05-16 NOTE — Patient Instructions (Signed)

## 2015-08-28 ENCOUNTER — Ambulatory Visit (HOSPITAL_COMMUNITY)
Admission: RE | Admit: 2015-08-28 | Discharge: 2015-08-28 | Disposition: A | Discharge status: Home / Self Care | Payer: BLUE CROSS/BLUE SHIELD | Source: Ambulatory Visit | Attending: Urology | Admitting: Urology

## 2015-08-28 ENCOUNTER — Other Ambulatory Visit (HOSPITAL_COMMUNITY): Payer: Self-pay | Admitting: Urology

## 2015-08-28 DIAGNOSIS — C649 Malignant neoplasm of unspecified kidney, except renal pelvis: Secondary | ICD-10-CM

## 2015-08-28 DIAGNOSIS — C641 Malignant neoplasm of right kidney, except renal pelvis: Secondary | ICD-10-CM | POA: Diagnosis present

## 2015-08-28 DIAGNOSIS — I517 Cardiomegaly: Secondary | ICD-10-CM | POA: Diagnosis not present

## 2016-01-20 ENCOUNTER — Other Ambulatory Visit: Payer: Self-pay | Admitting: Sports Medicine

## 2016-01-20 DIAGNOSIS — M25511 Pain in right shoulder: Secondary | ICD-10-CM

## 2016-02-01 ENCOUNTER — Inpatient Hospital Stay: Admission: RE | Admit: 2016-02-01 | Payer: BLUE CROSS/BLUE SHIELD | Source: Ambulatory Visit

## 2016-02-01 ENCOUNTER — Other Ambulatory Visit: Payer: BLUE CROSS/BLUE SHIELD

## 2016-06-30 ENCOUNTER — Ambulatory Visit (HOSPITAL_COMMUNITY)
Admission: RE | Admit: 2016-06-30 | Discharge: 2016-06-30 | Disposition: A | Discharge status: Home / Self Care | Payer: BLUE CROSS/BLUE SHIELD | Source: Ambulatory Visit | Attending: Cardiovascular Disease | Admitting: Cardiovascular Disease

## 2016-06-30 ENCOUNTER — Other Ambulatory Visit (HOSPITAL_COMMUNITY): Payer: Self-pay | Admitting: Orthopedic Surgery

## 2016-06-30 DIAGNOSIS — N189 Chronic kidney disease, unspecified: Secondary | ICD-10-CM | POA: Diagnosis not present

## 2016-06-30 DIAGNOSIS — M79601 Pain in right arm: Secondary | ICD-10-CM

## 2016-06-30 DIAGNOSIS — E1122 Type 2 diabetes mellitus with diabetic chronic kidney disease: Secondary | ICD-10-CM | POA: Diagnosis not present

## 2016-06-30 DIAGNOSIS — G4730 Sleep apnea, unspecified: Secondary | ICD-10-CM | POA: Diagnosis not present

## 2016-10-21 ENCOUNTER — Ambulatory Visit (HOSPITAL_COMMUNITY)
Admission: RE | Admit: 2016-10-21 | Discharge: 2016-10-21 | Disposition: A | Discharge status: Home / Self Care | Payer: BLUE CROSS/BLUE SHIELD | Source: Ambulatory Visit | Attending: Urology | Admitting: Urology

## 2016-10-21 ENCOUNTER — Other Ambulatory Visit (HOSPITAL_COMMUNITY): Payer: Self-pay | Admitting: Urology

## 2016-10-21 DIAGNOSIS — C649 Malignant neoplasm of unspecified kidney, except renal pelvis: Secondary | ICD-10-CM

## 2016-10-21 DIAGNOSIS — J984 Other disorders of lung: Secondary | ICD-10-CM | POA: Insufficient documentation

## 2016-10-21 DIAGNOSIS — I517 Cardiomegaly: Secondary | ICD-10-CM | POA: Insufficient documentation

## 2017-03-01 IMAGING — CR DG CHEST 2V
2 series · 2 of 2 positions shown · non-contrast
Comparison: 01/10/2014

CLINICAL DATA: Right kidney cancer.

EXAM:
CHEST  2 VIEW

[w chest pa]
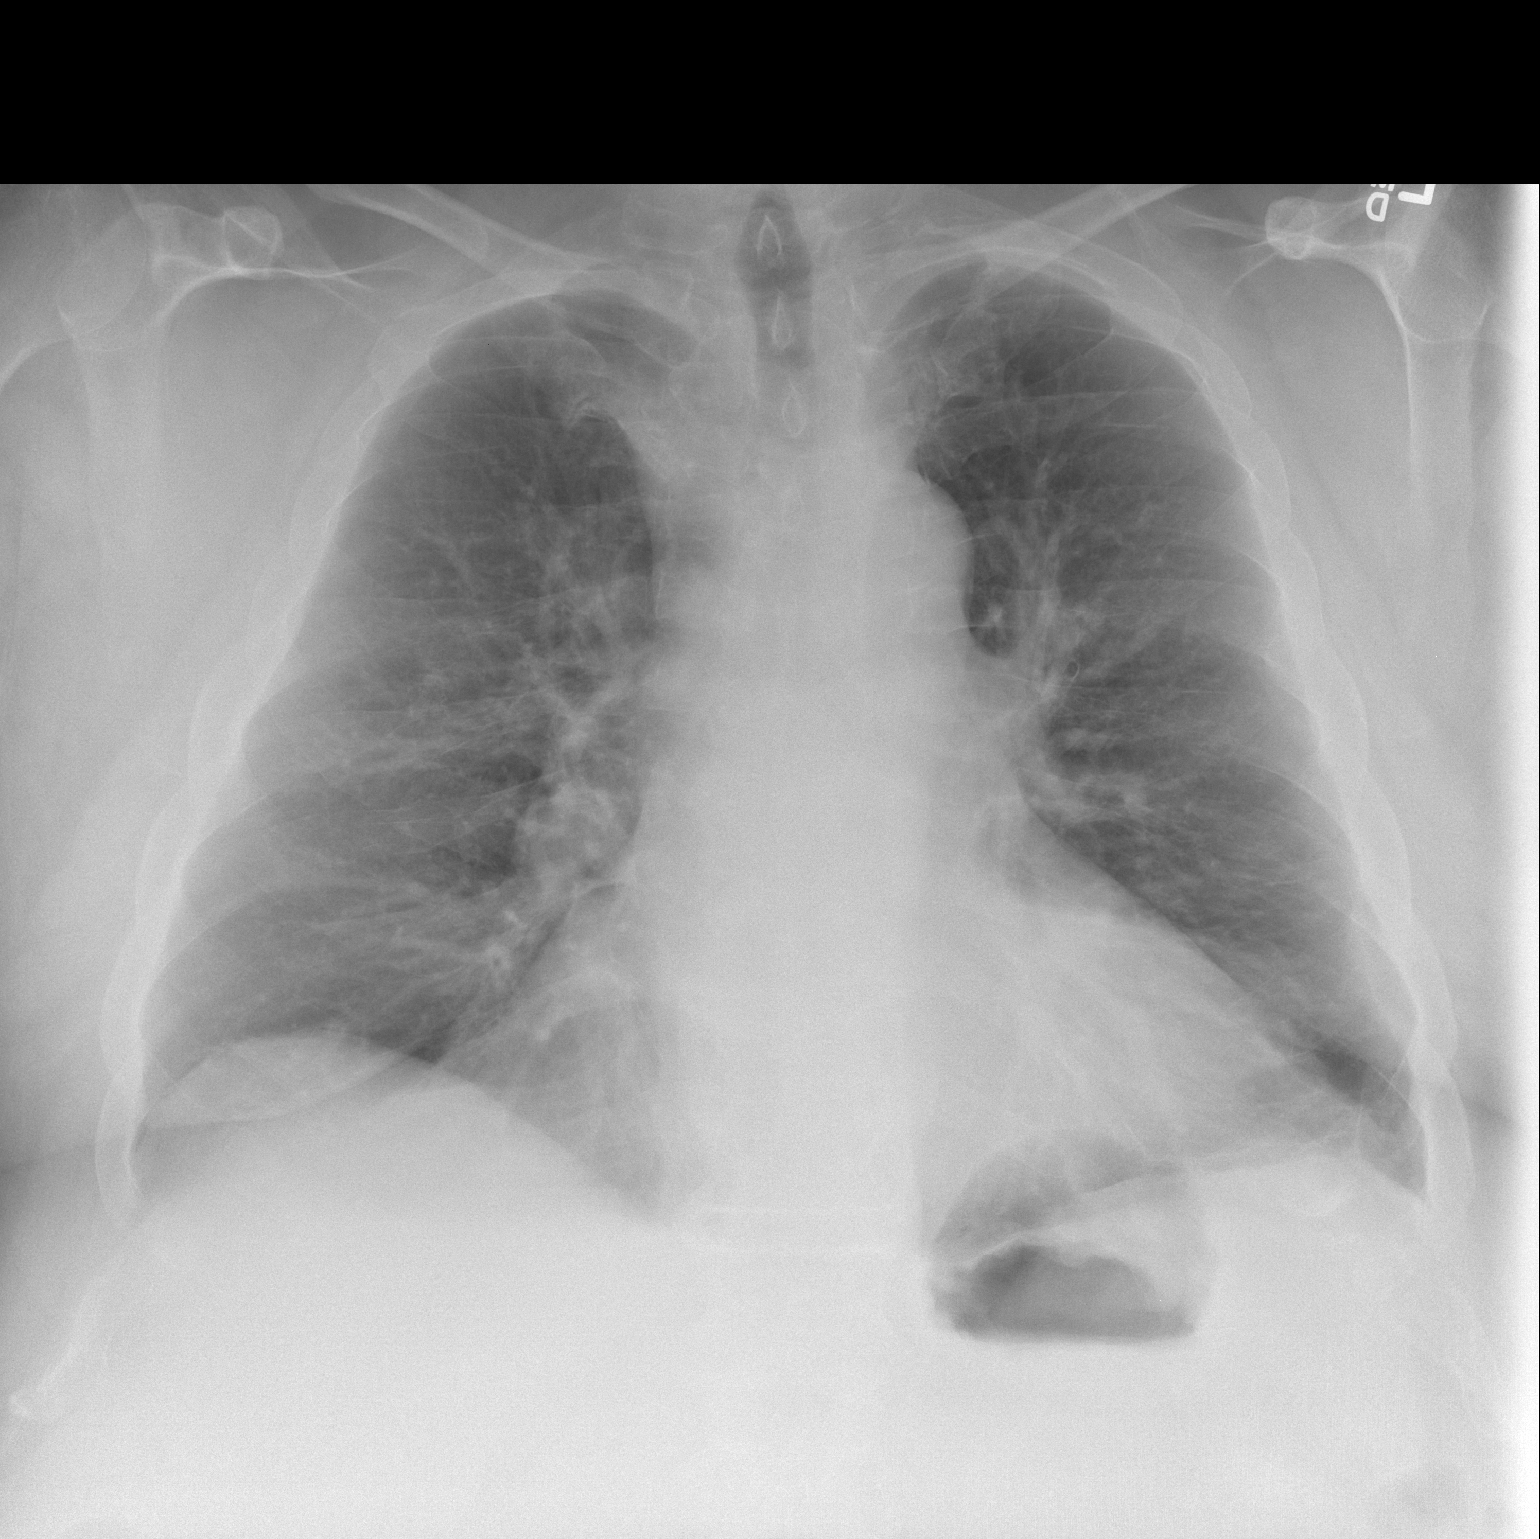

[w chest lat *]
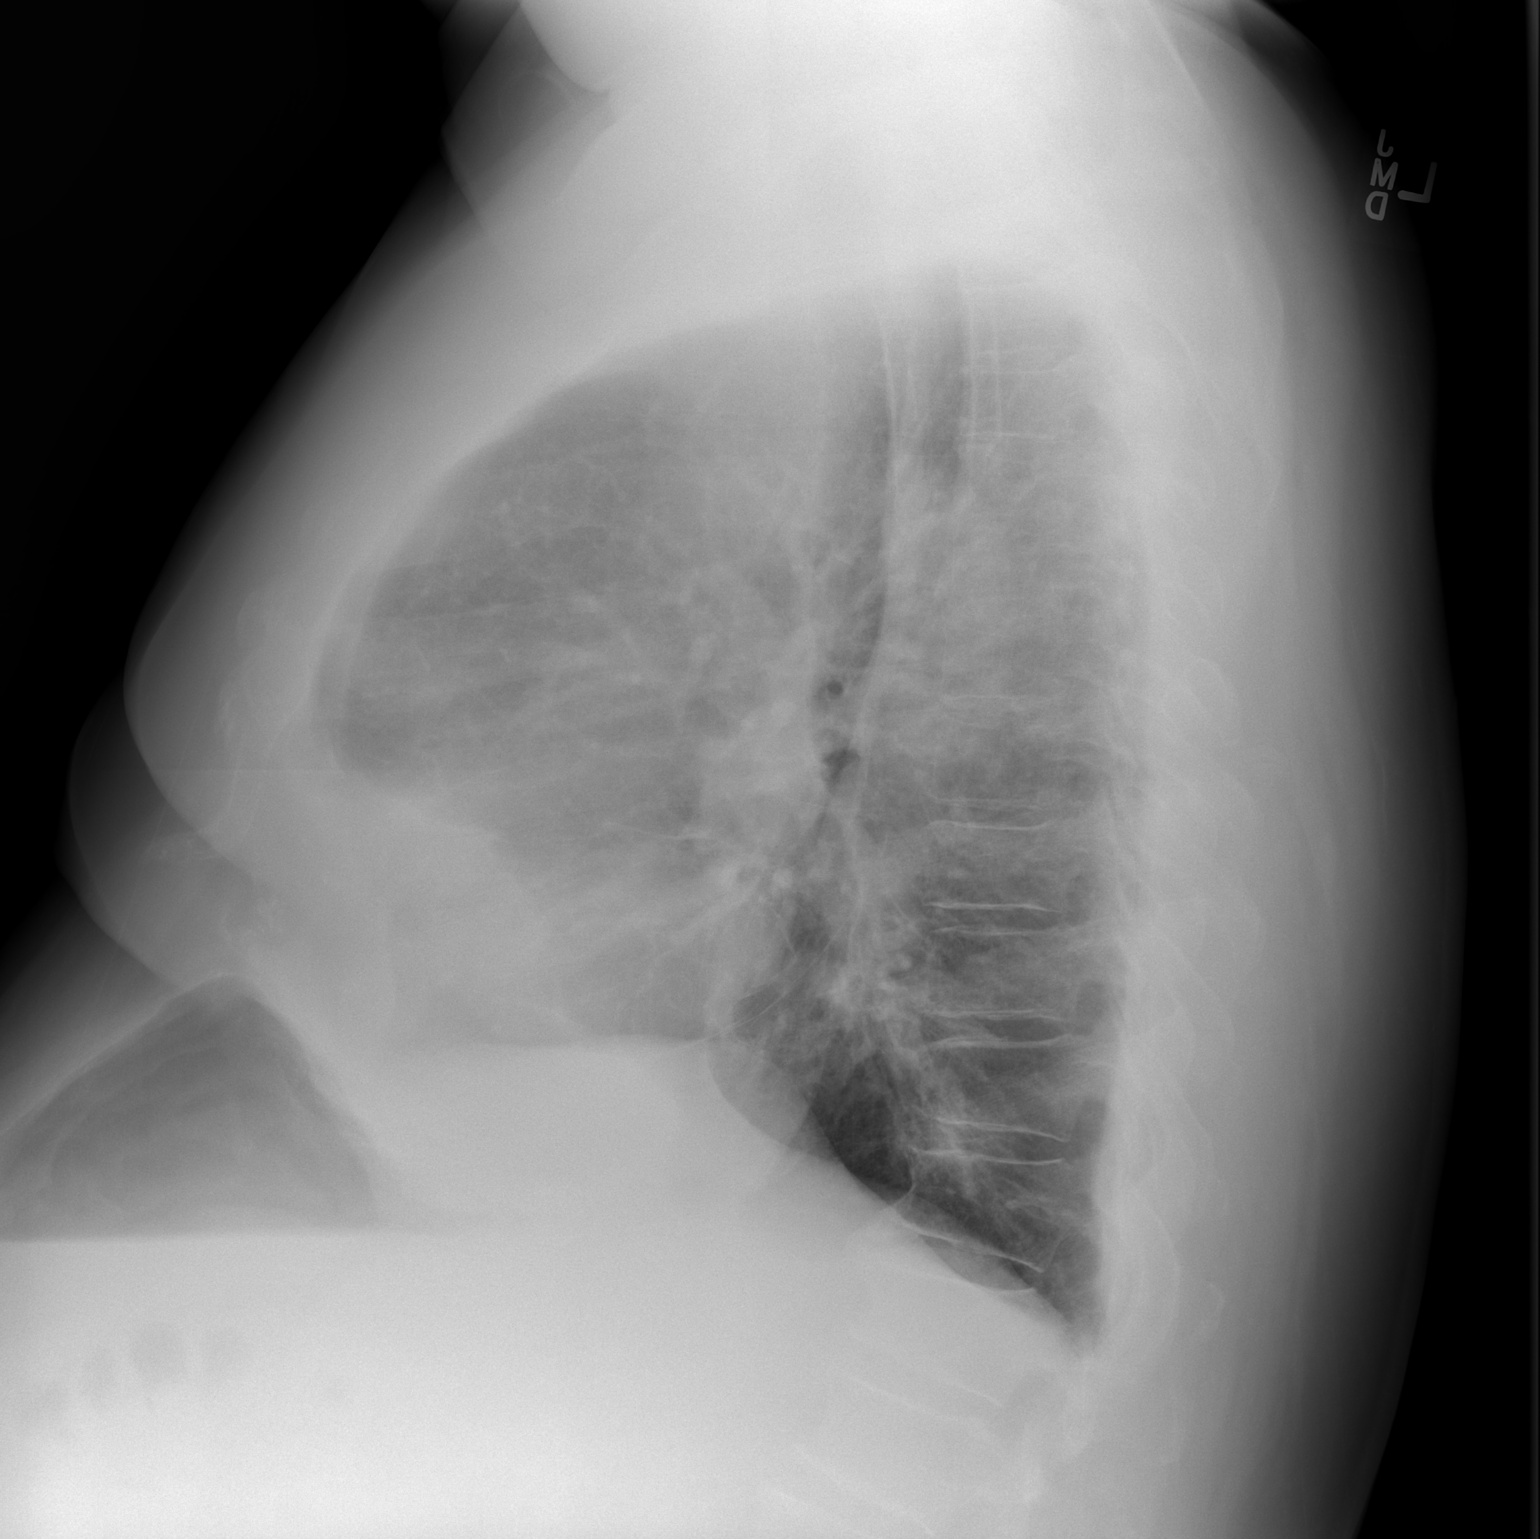

[2 of 2 positions shown; findings below may reference images not displayed]

FINDINGS: The heart is moderately enlarged. Pulmonary vascularity is within
normal limits. No pleural effusion or pneumothorax. No
consolidation.
IMPRESSION: Cardiomegaly without decompensation.

## 2021-11-11 DEATH — deceased
